# Patient Record
Sex: Male | Born: 1935 | Race: White | Hispanic: No | Smoking: Former smoker
Health system: Southern US, Community
[De-identification: ages and names within clinical notes are randomized; demographics above are authoritative.]

## PROBLEM LIST (undated history)

## (undated) DIAGNOSIS — I639 Cerebral infarction, unspecified: Secondary | ICD-10-CM

## (undated) DIAGNOSIS — E785 Hyperlipidemia, unspecified: Secondary | ICD-10-CM

## (undated) DIAGNOSIS — I10 Essential (primary) hypertension: Secondary | ICD-10-CM

## (undated) DIAGNOSIS — N179 Acute kidney failure, unspecified: Secondary | ICD-10-CM

## (undated) DIAGNOSIS — D4100 Neoplasm of uncertain behavior of unspecified kidney: Secondary | ICD-10-CM

## (undated) DIAGNOSIS — I82409 Acute embolism and thrombosis of unspecified deep veins of unspecified lower extremity: Secondary | ICD-10-CM

## (undated) DIAGNOSIS — G629 Polyneuropathy, unspecified: Secondary | ICD-10-CM

## (undated) DIAGNOSIS — N3000 Acute cystitis without hematuria: Secondary | ICD-10-CM

## (undated) DIAGNOSIS — R42 Dizziness and giddiness: Secondary | ICD-10-CM

## (undated) DIAGNOSIS — R339 Retention of urine, unspecified: Secondary | ICD-10-CM

## (undated) DIAGNOSIS — Y92009 Unspecified place in unspecified non-institutional (private) residence as the place of occurrence of the external cause: Secondary | ICD-10-CM

## (undated) DIAGNOSIS — E876 Hypokalemia: Secondary | ICD-10-CM

## (undated) DIAGNOSIS — N3941 Urge incontinence: Secondary | ICD-10-CM

## (undated) DIAGNOSIS — I4891 Unspecified atrial fibrillation: Secondary | ICD-10-CM

## (undated) DIAGNOSIS — I499 Cardiac arrhythmia, unspecified: Secondary | ICD-10-CM

## (undated) DIAGNOSIS — N3281 Overactive bladder: Secondary | ICD-10-CM

## (undated) DIAGNOSIS — I2510 Atherosclerotic heart disease of native coronary artery without angina pectoris: Secondary | ICD-10-CM

## (undated) DIAGNOSIS — W19XXXA Unspecified fall, initial encounter: Secondary | ICD-10-CM

## (undated) DIAGNOSIS — C61 Malignant neoplasm of prostate: Secondary | ICD-10-CM

## (undated) DIAGNOSIS — M1990 Unspecified osteoarthritis, unspecified site: Secondary | ICD-10-CM

## (undated) DIAGNOSIS — R351 Nocturia: Secondary | ICD-10-CM

## (undated) DIAGNOSIS — B029 Zoster without complications: Secondary | ICD-10-CM

## (undated) DIAGNOSIS — I2699 Other pulmonary embolism without acute cor pulmonale: Secondary | ICD-10-CM

## (undated) DIAGNOSIS — R159 Full incontinence of feces: Secondary | ICD-10-CM

## (undated) DIAGNOSIS — L719 Rosacea, unspecified: Secondary | ICD-10-CM

## (undated) HISTORY — DX: Acute kidney failure, unspecified: N17.9

## (undated) HISTORY — DX: Unspecified place in unspecified non-institutional (private) residence as the place of occurrence of the external cause: W19.XXXA

## (undated) HISTORY — DX: Other pulmonary embolism without acute cor pulmonale: I26.99

## (undated) HISTORY — DX: Overactive bladder: N32.81

## (undated) HISTORY — DX: Essential (primary) hypertension: I10

## (undated) HISTORY — DX: Malignant neoplasm of prostate: C61

## (undated) HISTORY — PX: APPENDECTOMY: SHX54

## (undated) HISTORY — DX: Unspecified atrial fibrillation: I48.91

## (undated) HISTORY — PX: NASAL SINUS SURGERY: SHX719

## (undated) HISTORY — DX: Unspecified osteoarthritis, unspecified site: M19.90

## (undated) HISTORY — DX: Unspecified place in unspecified non-institutional (private) residence as the place of occurrence of the external cause: Y92.009

## (undated) HISTORY — DX: Neoplasm of uncertain behavior of unspecified kidney: D41.00

## (undated) HISTORY — DX: Rosacea, unspecified: L71.9

## (undated) HISTORY — PX: CHOLECYSTECTOMY: SHX55

## (undated) HISTORY — PX: OTHER SURGICAL HISTORY: SHX169

## (undated) HISTORY — DX: Nocturia: R35.1

## (undated) HISTORY — DX: Zoster without complications: B02.9

## (undated) HISTORY — DX: Full incontinence of feces: R15.9

## (undated) HISTORY — DX: Hyperlipidemia, unspecified: E78.5

## (undated) HISTORY — DX: Polyneuropathy, unspecified: G62.9

## (undated) HISTORY — DX: Retention of urine, unspecified: R33.9

## (undated) HISTORY — DX: Dizziness and giddiness: R42

## (undated) HISTORY — DX: Atherosclerotic heart disease of native coronary artery without angina pectoris: I25.10

## (undated) HISTORY — DX: Urge incontinence: N39.41

## (undated) HISTORY — DX: Acute embolism and thrombosis of unspecified deep veins of unspecified lower extremity: I82.409

## (undated) HISTORY — DX: Cardiac arrhythmia, unspecified: I49.9

## (undated) HISTORY — DX: Acute cystitis without hematuria: N30.00

## (undated) HISTORY — DX: Cerebral infarction, unspecified: I63.9

## (undated) HISTORY — PX: HERNIA REPAIR: SHX51

## (undated) HISTORY — DX: Hypokalemia: E87.6

---

## 1993-01-22 HISTORY — PX: CATARACT EXTRACTION: SUR2

## 2004-10-28 ENCOUNTER — Emergency Department: Payer: Self-pay | Admitting: Emergency Medicine

## 2004-10-28 ENCOUNTER — Other Ambulatory Visit: Payer: Self-pay

## 2008-07-30 ENCOUNTER — Ambulatory Visit: Payer: Self-pay | Admitting: Unknown Physician Specialty

## 2009-02-19 ENCOUNTER — Inpatient Hospital Stay: Payer: Self-pay | Admitting: Unknown Physician Specialty

## 2010-04-25 ENCOUNTER — Inpatient Hospital Stay: Payer: Self-pay | Admitting: Internal Medicine

## 2010-05-04 DIAGNOSIS — I209 Angina pectoris, unspecified: Secondary | ICD-10-CM

## 2010-05-04 DIAGNOSIS — I4891 Unspecified atrial fibrillation: Secondary | ICD-10-CM

## 2010-05-04 DIAGNOSIS — I6990 Unspecified sequelae of unspecified cerebrovascular disease: Secondary | ICD-10-CM

## 2010-09-27 ENCOUNTER — Ambulatory Visit: Payer: Self-pay | Admitting: Internal Medicine

## 2010-10-16 ENCOUNTER — Ambulatory Visit: Payer: Self-pay | Admitting: Unknown Physician Specialty

## 2010-10-16 ENCOUNTER — Inpatient Hospital Stay: Payer: Self-pay | Admitting: Internal Medicine

## 2010-10-21 ENCOUNTER — Other Ambulatory Visit: Payer: Self-pay | Admitting: Unknown Physician Specialty

## 2010-12-27 ENCOUNTER — Ambulatory Visit: Payer: Self-pay | Admitting: Urology

## 2011-04-16 ENCOUNTER — Other Ambulatory Visit: Payer: Medicare Other

## 2011-05-15 ENCOUNTER — Other Ambulatory Visit: Payer: Medicare Other

## 2011-12-12 DIAGNOSIS — D419 Neoplasm of uncertain behavior of unspecified urinary organ: Secondary | ICD-10-CM | POA: Insufficient documentation

## 2011-12-12 DIAGNOSIS — N3941 Urge incontinence: Secondary | ICD-10-CM | POA: Insufficient documentation

## 2011-12-12 DIAGNOSIS — R351 Nocturia: Secondary | ICD-10-CM | POA: Insufficient documentation

## 2011-12-12 DIAGNOSIS — N390 Urinary tract infection, site not specified: Secondary | ICD-10-CM | POA: Insufficient documentation

## 2011-12-12 DIAGNOSIS — R339 Retention of urine, unspecified: Secondary | ICD-10-CM | POA: Insufficient documentation

## 2011-12-12 DIAGNOSIS — N3000 Acute cystitis without hematuria: Secondary | ICD-10-CM | POA: Insufficient documentation

## 2011-12-12 DIAGNOSIS — Z23 Encounter for immunization: Secondary | ICD-10-CM | POA: Insufficient documentation

## 2011-12-12 DIAGNOSIS — C61 Malignant neoplasm of prostate: Secondary | ICD-10-CM | POA: Insufficient documentation

## 2012-03-31 ENCOUNTER — Emergency Department: Payer: Self-pay | Admitting: Emergency Medicine

## 2012-03-31 LAB — PROTIME-INR: INR: 2.5

## 2012-06-21 LAB — CBC
HCT: 37.9 % — ABNORMAL LOW (ref 40.0–52.0)
HGB: 12.9 g/dL — ABNORMAL LOW (ref 13.0–18.0)
MCHC: 34 g/dL (ref 32.0–36.0)
MCV: 89 fL (ref 80–100)
Platelet: 220 10*3/uL (ref 150–440)
RDW: 14.9 % — ABNORMAL HIGH (ref 11.5–14.5)
WBC: 16.4 10*3/uL — ABNORMAL HIGH (ref 3.8–10.6)

## 2012-06-21 LAB — COMPREHENSIVE METABOLIC PANEL
Alkaline Phosphatase: 73 U/L (ref 50–136)
Anion Gap: 11 (ref 7–16)
Calcium, Total: 8.8 mg/dL (ref 8.5–10.1)
Chloride: 103 mmol/L (ref 98–107)
Creatinine: 1.28 mg/dL (ref 0.60–1.30)
EGFR (African American): 56 — ABNORMAL LOW
EGFR (Non-African Amer.): 48 — ABNORMAL LOW
Glucose: 205 mg/dL — ABNORMAL HIGH (ref 65–99)
Osmolality: 281 (ref 275–301)
SGOT(AST): 33 U/L (ref 15–37)
SGPT (ALT): 21 U/L (ref 12–78)
Sodium: 136 mmol/L (ref 136–145)

## 2012-06-21 LAB — CK TOTAL AND CKMB (NOT AT ARMC)
CK, Total: 193 U/L (ref 35–232)
CK-MB: 2.6 ng/mL (ref 0.5–3.6)

## 2012-06-21 LAB — PROTIME-INR: Prothrombin Time: 22.9 secs — ABNORMAL HIGH (ref 11.5–14.7)

## 2012-06-21 LAB — APTT: Activated PTT: 51.4 secs — ABNORMAL HIGH (ref 23.6–35.9)

## 2012-06-22 ENCOUNTER — Observation Stay: Payer: Self-pay

## 2012-06-22 LAB — URINALYSIS, COMPLETE
Bacteria: NONE SEEN
Blood: NEGATIVE
Hyaline Cast: 12
Ph: 5 (ref 4.5–8.0)
Specific Gravity: 1.019 (ref 1.003–1.030)
Squamous Epithelial: <1
WBC UR: 2 /HPF (ref 0–5)

## 2012-06-23 LAB — CBC WITH DIFFERENTIAL/PLATELET
Basophil #: 0 10*3/uL (ref 0.0–0.1)
Basophil %: 0.3 %
Eosinophil #: 0 10*3/uL (ref 0.0–0.7)
Eosinophil %: 0.2 %
HCT: 35.7 % — ABNORMAL LOW (ref 40.0–52.0)
HGB: 12.3 g/dL — ABNORMAL LOW (ref 13.0–18.0)
Lymphocyte %: 12.8 %
Monocyte #: 1.1 x10 3/mm — ABNORMAL HIGH (ref 0.2–1.0)
Monocyte %: 12.1 %
Neutrophil %: 74.6 %
Platelet: 208 10*3/uL (ref 150–440)
RDW: 14.5 % (ref 11.5–14.5)

## 2012-06-23 LAB — COMPREHENSIVE METABOLIC PANEL
Albumin: 2.8 g/dL — ABNORMAL LOW (ref 3.4–5.0)
BUN: 14 mg/dL (ref 7–18)
Chloride: 101 mmol/L (ref 98–107)
Co2: 27 mmol/L (ref 21–32)
Creatinine: 1.05 mg/dL (ref 0.60–1.30)
EGFR (African American): >60
Glucose: 161 mg/dL — ABNORMAL HIGH (ref 65–99)
Osmolality: 274 (ref 275–301)
Potassium: 3.7 mmol/L (ref 3.5–5.1)
Sodium: 135 mmol/L — ABNORMAL LOW (ref 136–145)

## 2012-06-27 LAB — CULTURE, BLOOD (SINGLE)

## 2013-05-20 DIAGNOSIS — I4891 Unspecified atrial fibrillation: Secondary | ICD-10-CM | POA: Insufficient documentation

## 2013-07-31 DIAGNOSIS — M179 Osteoarthritis of knee, unspecified: Secondary | ICD-10-CM | POA: Insufficient documentation

## 2013-07-31 DIAGNOSIS — M1710 Unilateral primary osteoarthritis, unspecified knee: Secondary | ICD-10-CM | POA: Insufficient documentation

## 2013-10-08 ENCOUNTER — Inpatient Hospital Stay: Payer: Self-pay | Admitting: Internal Medicine

## 2013-10-08 LAB — COMPREHENSIVE METABOLIC PANEL
ALT: 54 U/L
Albumin: 3 g/dL — ABNORMAL LOW (ref 3.4–5.0)
Alkaline Phosphatase: 74 U/L
Anion Gap: 13 (ref 7–16)
BUN: 35 mg/dL — AB (ref 7–18)
Bilirubin,Total: 0.8 mg/dL (ref 0.2–1.0)
CHLORIDE: 104 mmol/L (ref 98–107)
CO2: 20 mmol/L — AB (ref 21–32)
CREATININE: 1.64 mg/dL — AB (ref 0.60–1.30)
Calcium, Total: 8.7 mg/dL (ref 8.5–10.1)
EGFR (African American): 41 — ABNORMAL LOW
EGFR (Non-African Amer.): 35 — ABNORMAL LOW
Glucose: 170 mg/dL — ABNORMAL HIGH (ref 65–99)
Osmolality: 286 (ref 275–301)
POTASSIUM: 4 mmol/L (ref 3.5–5.1)
SGOT(AST): 259 U/L — ABNORMAL HIGH (ref 15–37)
SODIUM: 137 mmol/L (ref 136–145)
TOTAL PROTEIN: 6.4 g/dL (ref 6.4–8.2)

## 2013-10-08 LAB — URINALYSIS, COMPLETE
Bilirubin,UR: NEGATIVE
GLUCOSE, UR: NEGATIVE mg/dL (ref 0–75)
NITRITE: NEGATIVE
PH: 5 (ref 4.5–8.0)
Protein: 100
RBC,UR: 22 /HPF (ref 0–5)
SQUAMOUS EPITHELIAL: NONE SEEN
Specific Gravity: 1.016 (ref 1.003–1.030)
Transitional Epi: 2
WBC UR: 937 /HPF (ref 0–5)

## 2013-10-08 LAB — CK-MB
CK-MB: 76.6 ng/mL — ABNORMAL HIGH (ref 0.5–3.6)
CK-MB: 55.9 ng/mL — ABNORMAL HIGH (ref 0.5–3.6)

## 2013-10-08 LAB — CBC
HCT: 35.6 % — ABNORMAL LOW (ref 40.0–52.0)
HGB: 12.2 g/dL — ABNORMAL LOW (ref 13.0–18.0)
MCH: 31.2 pg (ref 26.0–34.0)
MCHC: 34.3 g/dL (ref 32.0–36.0)
MCV: 91 fL (ref 80–100)
Platelet: 204 10*3/uL (ref 150–440)
RBC: 3.91 10*6/uL — ABNORMAL LOW (ref 4.40–5.90)
RDW: 14.3 % (ref 11.5–14.5)
WBC: 13.2 10*3/uL — AB (ref 3.8–10.6)

## 2013-10-08 LAB — CK: CK, TOTAL: 11416 U/L — AB

## 2013-10-08 LAB — DIGOXIN LEVEL: Digoxin: 1.6 ng/mL

## 2013-10-08 LAB — PROTIME-INR
INR: 3
Prothrombin Time: 30.1 secs — ABNORMAL HIGH (ref 11.5–14.7)

## 2013-10-08 LAB — APTT: Activated PTT: 73.2 secs — ABNORMAL HIGH (ref 23.6–35.9)

## 2013-10-08 LAB — TROPONIN I: Troponin-I: 0.4 ng/mL — ABNORMAL HIGH

## 2013-10-09 LAB — CBC WITH DIFFERENTIAL/PLATELET
Basophil #: 0 10*3/uL (ref 0.0–0.1)
Basophil %: 0.1 %
EOS ABS: 0 10*3/uL (ref 0.0–0.7)
Eosinophil %: 0 %
HCT: 33.8 % — ABNORMAL LOW (ref 40.0–52.0)
HGB: 11 g/dL — AB (ref 13.0–18.0)
Lymphocyte #: 0.5 10*3/uL — ABNORMAL LOW (ref 1.0–3.6)
Lymphocyte %: 3.5 %
MCH: 30.1 pg (ref 26.0–34.0)
MCHC: 32.6 g/dL (ref 32.0–36.0)
MCV: 92 fL (ref 80–100)
Monocyte #: 1.2 x10 3/mm — ABNORMAL HIGH (ref 0.2–1.0)
Monocyte %: 7.6 %
Neutrophil #: 13.9 10*3/uL — ABNORMAL HIGH (ref 1.4–6.5)
Neutrophil %: 88.8 %
PLATELETS: 174 10*3/uL (ref 150–440)
RBC: 3.66 10*6/uL — ABNORMAL LOW (ref 4.40–5.90)
RDW: 14.4 % (ref 11.5–14.5)
WBC: 15.6 10*3/uL — ABNORMAL HIGH (ref 3.8–10.6)

## 2013-10-09 LAB — BASIC METABOLIC PANEL
ANION GAP: 14 (ref 7–16)
BUN: 41 mg/dL — ABNORMAL HIGH (ref 7–18)
CALCIUM: 8.6 mg/dL (ref 8.5–10.1)
CHLORIDE: 105 mmol/L (ref 98–107)
Co2: 17 mmol/L — ABNORMAL LOW (ref 21–32)
Creatinine: 1.55 mg/dL — ABNORMAL HIGH (ref 0.60–1.30)
EGFR (African American): 44 — ABNORMAL LOW
EGFR (Non-African Amer.): 38 — ABNORMAL LOW
Glucose: 166 mg/dL — ABNORMAL HIGH (ref 65–99)
Osmolality: 286 (ref 275–301)
POTASSIUM: 5.5 mmol/L — AB (ref 3.5–5.1)
Sodium: 136 mmol/L (ref 136–145)

## 2013-10-09 LAB — CK-MB: CK-MB: 39.3 ng/mL — AB (ref 0.5–3.6)

## 2013-10-09 LAB — CK: CK, Total: 5306 U/L — ABNORMAL HIGH

## 2013-10-09 LAB — POTASSIUM: Potassium: 3.6 mmol/L (ref 3.5–5.1)

## 2013-10-09 LAB — TROPONIN I: Troponin-I: 0.28 ng/mL — ABNORMAL HIGH

## 2013-10-10 LAB — CBC WITH DIFFERENTIAL/PLATELET
Basophil #: 0 10*3/uL (ref 0.0–0.1)
Basophil %: 0.2 %
EOS ABS: 0.1 10*3/uL (ref 0.0–0.7)
Eosinophil %: 0.9 %
HCT: 32.8 % — ABNORMAL LOW (ref 40.0–52.0)
HGB: 11 g/dL — ABNORMAL LOW (ref 13.0–18.0)
Lymphocyte #: 0.9 10*3/uL — ABNORMAL LOW (ref 1.0–3.6)
Lymphocyte %: 8.6 %
MCH: 30.6 pg (ref 26.0–34.0)
MCHC: 33.6 g/dL (ref 32.0–36.0)
MCV: 91 fL (ref 80–100)
Monocyte #: 0.5 x10 3/mm (ref 0.2–1.0)
Monocyte %: 5 %
Neutrophil #: 9 10*3/uL — ABNORMAL HIGH (ref 1.4–6.5)
Neutrophil %: 85.3 %
Platelet: 168 10*3/uL (ref 150–440)
RBC: 3.61 10*6/uL — ABNORMAL LOW (ref 4.40–5.90)
RDW: 14.8 % — AB (ref 11.5–14.5)
WBC: 10.5 10*3/uL (ref 3.8–10.6)

## 2013-10-10 LAB — MAGNESIUM: MAGNESIUM: 1.8 mg/dL

## 2013-10-10 LAB — BASIC METABOLIC PANEL
Anion Gap: 12 (ref 7–16)
BUN: 44 mg/dL — AB (ref 7–18)
CHLORIDE: 101 mmol/L (ref 98–107)
CREATININE: 1.59 mg/dL — AB (ref 0.60–1.30)
Calcium, Total: 7.9 mg/dL — ABNORMAL LOW (ref 8.5–10.1)
Co2: 23 mmol/L (ref 21–32)
EGFR (Non-African Amer.): 37 — ABNORMAL LOW
GFR CALC AF AMER: 42 — AB
Glucose: 153 mg/dL — ABNORMAL HIGH (ref 65–99)
OSMOLALITY: 286 (ref 275–301)
Potassium: 3.2 mmol/L — ABNORMAL LOW (ref 3.5–5.1)
SODIUM: 136 mmol/L (ref 136–145)

## 2013-10-10 LAB — TROPONIN I: TROPONIN-I: 0.2 ng/mL — AB

## 2013-10-10 LAB — PROTIME-INR
INR: 2.4
PROTHROMBIN TIME: 25.3 s — AB (ref 11.5–14.7)

## 2013-10-11 LAB — PROTIME-INR
INR: 2.3
PROTHROMBIN TIME: 24.7 s — AB (ref 11.5–14.7)

## 2013-10-11 LAB — BASIC METABOLIC PANEL
Anion Gap: 12 (ref 7–16)
BUN: 37 mg/dL — AB (ref 7–18)
CHLORIDE: 104 mmol/L (ref 98–107)
CREATININE: 1.44 mg/dL — AB (ref 0.60–1.30)
Calcium, Total: 7.8 mg/dL — ABNORMAL LOW (ref 8.5–10.1)
Co2: 22 mmol/L (ref 21–32)
EGFR (Non-African Amer.): 41 — ABNORMAL LOW
GFR CALC AF AMER: 48 — AB
GLUCOSE: 142 mg/dL — AB (ref 65–99)
Osmolality: 287 (ref 275–301)
Potassium: 3.5 mmol/L (ref 3.5–5.1)
SODIUM: 138 mmol/L (ref 136–145)

## 2013-10-11 LAB — CK: CK, Total: 1256 U/L — ABNORMAL HIGH

## 2013-10-12 LAB — CULTURE, BLOOD (SINGLE)

## 2013-10-12 LAB — BASIC METABOLIC PANEL
Anion Gap: 7 (ref 7–16)
BUN: 34 mg/dL — ABNORMAL HIGH (ref 7–18)
CHLORIDE: 106 mmol/L (ref 98–107)
CO2: 26 mmol/L (ref 21–32)
Calcium, Total: 8.1 mg/dL — ABNORMAL LOW (ref 8.5–10.1)
Creatinine: 1.31 mg/dL — ABNORMAL HIGH (ref 0.60–1.30)
EGFR (African American): 54 — ABNORMAL LOW
GFR CALC NON AF AMER: 46 — AB
Glucose: 139 mg/dL — ABNORMAL HIGH (ref 65–99)
OSMOLALITY: 287 (ref 275–301)
Potassium: 3.8 mmol/L (ref 3.5–5.1)
Sodium: 139 mmol/L (ref 136–145)

## 2013-10-12 LAB — URINE CULTURE

## 2013-10-12 LAB — PROTIME-INR
INR: 2.3
Prothrombin Time: 24.8 secs — ABNORMAL HIGH (ref 11.5–14.7)

## 2013-10-13 ENCOUNTER — Non-Acute Institutional Stay (SKILLED_NURSING_FACILITY): Payer: Medicare Other | Admitting: Adult Health

## 2013-10-13 ENCOUNTER — Other Ambulatory Visit: Payer: Self-pay | Admitting: *Deleted

## 2013-10-13 DIAGNOSIS — I4891 Unspecified atrial fibrillation: Secondary | ICD-10-CM

## 2013-10-13 DIAGNOSIS — K219 Gastro-esophageal reflux disease without esophagitis: Secondary | ICD-10-CM

## 2013-10-13 DIAGNOSIS — I10 Essential (primary) hypertension: Secondary | ICD-10-CM

## 2013-10-13 DIAGNOSIS — B9620 Unspecified Escherichia coli [E. coli] as the cause of diseases classified elsewhere: Secondary | ICD-10-CM

## 2013-10-13 DIAGNOSIS — R5381 Other malaise: Secondary | ICD-10-CM

## 2013-10-13 DIAGNOSIS — I82409 Acute embolism and thrombosis of unspecified deep veins of unspecified lower extremity: Secondary | ICD-10-CM

## 2013-10-13 DIAGNOSIS — I482 Chronic atrial fibrillation, unspecified: Secondary | ICD-10-CM

## 2013-10-13 DIAGNOSIS — A498 Other bacterial infections of unspecified site: Secondary | ICD-10-CM

## 2013-10-13 DIAGNOSIS — Z7901 Long term (current) use of anticoagulants: Secondary | ICD-10-CM

## 2013-10-13 DIAGNOSIS — I6990 Unspecified sequelae of unspecified cerebrovascular disease: Secondary | ICD-10-CM

## 2013-10-13 DIAGNOSIS — N390 Urinary tract infection, site not specified: Secondary | ICD-10-CM

## 2013-10-13 MED ORDER — HYDROCODONE-ACETAMINOPHEN 5-325 MG PO TABS: ORAL_TABLET | ORAL | Status: DC

## 2013-10-13 NOTE — Telephone Encounter (Signed)
[facility] 

## 2013-10-14 ENCOUNTER — Encounter: Payer: Self-pay | Admitting: Adult Health

## 2013-10-14 DIAGNOSIS — I482 Chronic atrial fibrillation, unspecified: Secondary | ICD-10-CM | POA: Insufficient documentation

## 2013-10-14 DIAGNOSIS — I10 Essential (primary) hypertension: Secondary | ICD-10-CM | POA: Insufficient documentation

## 2013-10-14 DIAGNOSIS — B9620 Unspecified Escherichia coli [E. coli] as the cause of diseases classified elsewhere: Secondary | ICD-10-CM | POA: Insufficient documentation

## 2013-10-14 DIAGNOSIS — Z7901 Long term (current) use of anticoagulants: Secondary | ICD-10-CM | POA: Insufficient documentation

## 2013-10-14 DIAGNOSIS — I82409 Acute embolism and thrombosis of unspecified deep veins of unspecified lower extremity: Secondary | ICD-10-CM | POA: Insufficient documentation

## 2013-10-14 DIAGNOSIS — I6990 Unspecified sequelae of unspecified cerebrovascular disease: Secondary | ICD-10-CM | POA: Insufficient documentation

## 2013-10-14 DIAGNOSIS — K219 Gastro-esophageal reflux disease without esophagitis: Secondary | ICD-10-CM | POA: Insufficient documentation

## 2013-10-14 DIAGNOSIS — N390 Urinary tract infection, site not specified: Secondary | ICD-10-CM

## 2013-10-14 DIAGNOSIS — R5381 Other malaise: Secondary | ICD-10-CM | POA: Insufficient documentation

## 2013-10-14 NOTE — Progress Notes (Signed)
Patient ID: Samuel Wagner, male   DOB: 07-24-1916, 80 y.o.   MRN: [id number]     [facility]  Allergies  Allergen Reactions  . Sulfa Antibiotics      Chief Complaint  Patient presents with  . Hospitalization Follow-up    HPI:  He had had 2 falls at home; was hospitalized with sepsis due to e-coli uti; rhabdomyolysis. I could not find the culture report in the hospital records. He is on long term coumadin therapy for afib; is status post dvt; cva as well. I have spoken with his pcp Dr. Ola Spurr who feels he will continue to benefit from coumadin therapy and asa therapy at this time. He had been living independently prior to his hospitalization with family support. His goal is to return back home.   Past Medical History  Diagnosis Date  . A-fib   . Acute renal failure   . DVT (deep venous thrombosis)   . Hypokalemia   . Hypertension   . Prostate cancer   . Stroke   . CAD (coronary artery disease)   . Hyperlipidemia   . Arthritis   . Pulmonary embolism   . Fall at home     Past Surgical History  Procedure Laterality Date  . Appendectomy    . Cholecystectomy    . Hernia repair    . Right eye surgery    . Nasal sinus surgery      VITAL SIGNS BP 143/87  Pulse 88  Ht 5' 9"  (1.753 m)  Wt 203 lb (92.08 kg)  BMI 29.96 kg/m2   Patient's Medications  New Prescriptions   No medications on file  Previous Medications   ASPIRIN 81 MG TABLET    Take 81 mg by mouth daily.   DIGOXIN (LANOXIN) 0.25 MG TABLET    Take 0.25 mg by mouth daily.   HYDROCODONE-ACETAMINOPHEN (NORCO/VICODIN) 5-325 MG PER TABLET    Take one tablet by mouth every 6 hours as needed for severe pain   METOPROLOL (LOPRESSOR) 50 MG TABLET    Take 25 mg by mouth 2 (two) times daily.   MULTIPLE VITAMINS-MINERALS (PRESERVISION AREDS) TABS    Take 1 tablet by mouth 2 (two) times daily.   PANTOPRAZOLE (PROTONIX) 40 MG TABLET    Take 40 mg by mouth daily.   TRAMADOL (ULTRAM) 50 MG TABLET    Take 50 mg by  mouth every 4 (four) hours as needed.   WARFARIN (COUMADIN) 1 MG TABLET    Take 1 mg by mouth daily at 6 PM. 4.5 mg daily  Modified Medications   No medications on file  Discontinued Medications   No medications on file    SIGNIFICANT DIAGNOSTIC EXAMS  10-08-13: left shoulder x-ray: equivocal nondisplaced intra-articular glenoid fracture. Moderate glenohumeral joint degenerative changes.   10-08-13: chest x-ray: no acute cardiopulmonary disease    LABS REVIEWED:  10-08-13 wbc 13.;2 hgb 12.2; hct 35.6; mcv 91 plt 204 glucose 170; bun 35; creat 1.64; k+4.0; na++137; alk phos 74; ast 54; alt 259; t bili 0.8; albumin 3.0 ptt 73.2 CK 11, 416; CK-MB: 76.6 #2: 55.9; dig 1.6 urine culture: gram neg rods.  10-09-13: wbc 15.6; hgb 11.0;; hct 33.8; mcv 92; plt 174; CK 5306; CK-MB: 39.3  10-10-13: wbc 10.5; hgb 11.0; hct 32.8; mcv 91; plt 168 glucose 153; bun 44; creat 1.59; k+3.2; na++136; mag 1.8  10-11-13: glucose 142; bun 37; creat 1.44; k+3.5; na++142; CK 1256  10-12-13: glucose 139; bun 34; creat 1.31; k+3.8;  na++139     Review of Systems  Constitutional: Positive for malaise/fatigue. Negative for weight loss.       Has had decreased appetite for the past 2 months without known weight loss  Respiratory: Negative for cough and shortness of breath.   Cardiovascular: Negative for chest pain, palpitations and leg swelling.  Gastrointestinal: Negative for heartburn, abdominal pain and constipation.  Musculoskeletal: Negative for joint pain and myalgias.  Skin: Negative.   Neurological: Negative for headaches.  Psychiatric/Behavioral: Negative for depression. The patient has insomnia. The patient is not nervous/anxious.      Physical Exam  Constitutional: He is oriented to person, place, and time. He appears well-developed and well-nourished.  Neck: Neck supple. No JVD present. No thyromegaly present.  Cardiovascular: Normal rate, normal heart sounds and intact distal pulses.   Heart rate  irregular   Respiratory: Effort normal and breath sounds normal. No respiratory distress. He has no wheezes.  GI: Soft. Bowel sounds are normal. He exhibits no distension. There is no tenderness.  Musculoskeletal: Normal range of motion. He exhibits no edema.  Neurological: He is alert and oriented to person, place, and time.  Skin: Skin is warm and dry. He is not diaphoretic.  Psychiatric: He has a normal mood and affect.      ASSESSMENT/ PLAN:  1. Afib: heart rate controlled; will continue digoxin 0.25 mg daily for rate control along with lopressor 25 mg twice daily for rate control; will continue asa 81 mg daily and coumadin 4.5 mg daily his inr is 2.7.   2. UTI from e-coli: will complete cipro and will monitor his status   3. Hypertension: will continue lopressor 25 mg twice daily  4. Gerd: will continue protonix 40 mg daily   5. Physical deconditioning with falls at home: will continue therapy as directed to improve upon his strength and independence with adl's. Will monitor his weight; and will provide nutritional support as indicated.   Time spent with patient 50 minutes.    Ok Edwards NP [facility]  Contact [phone] Monday through Friday 8am- 5pm  After hours call [phone]

## 2013-10-15 ENCOUNTER — Non-Acute Institutional Stay (SKILLED_NURSING_FACILITY): Payer: Medicare Other | Admitting: Internal Medicine

## 2013-10-15 DIAGNOSIS — N390 Urinary tract infection, site not specified: Secondary | ICD-10-CM

## 2013-10-15 DIAGNOSIS — S42152S Displaced fracture of neck of scapula, left shoulder, sequela: Secondary | ICD-10-CM

## 2013-10-15 DIAGNOSIS — S42153A Displaced fracture of neck of scapula, unspecified shoulder, initial encounter for closed fracture: Secondary | ICD-10-CM

## 2013-10-15 DIAGNOSIS — K5900 Constipation, unspecified: Secondary | ICD-10-CM

## 2013-10-15 DIAGNOSIS — B9620 Unspecified Escherichia coli [E. coli] as the cause of diseases classified elsewhere: Secondary | ICD-10-CM

## 2013-10-15 DIAGNOSIS — S42142S Displaced fracture of glenoid cavity of scapula, left shoulder, sequela: Secondary | ICD-10-CM

## 2013-10-15 DIAGNOSIS — I482 Chronic atrial fibrillation, unspecified: Secondary | ICD-10-CM

## 2013-10-15 DIAGNOSIS — I4891 Unspecified atrial fibrillation: Secondary | ICD-10-CM

## 2013-10-15 DIAGNOSIS — S42309S Unspecified fracture of shaft of humerus, unspecified arm, sequela: Secondary | ICD-10-CM

## 2013-10-15 DIAGNOSIS — S42143A Displaced fracture of glenoid cavity of scapula, unspecified shoulder, initial encounter for closed fracture: Secondary | ICD-10-CM | POA: Insufficient documentation

## 2013-10-15 DIAGNOSIS — A498 Other bacterial infections of unspecified site: Secondary | ICD-10-CM

## 2013-10-15 DIAGNOSIS — R5381 Other malaise: Secondary | ICD-10-CM

## 2013-10-15 DIAGNOSIS — K219 Gastro-esophageal reflux disease without esophagitis: Secondary | ICD-10-CM

## 2013-10-15 NOTE — Progress Notes (Signed)
Patient ID: Samuel Wagner, male   DOB: 05/13/1916, 80 y.o.   MRN: [id number]     Facility: [facility]    PCP: Viviana Simpler, MD  Code Status: DNR  Allergies  Allergen Reactions  . Sulfa Antibiotics     Chief Complaint: new admission  HPI:  80 y/o male patient is here for STR after hospital admission from 10/08/13-10/12/13 with a fall. He was diagnosed to have e.coli UTI and bacteremia. He was started on antibiotics. He also had rhabdomylosis and AKI  in setting of the fall and responded well to iv fluids. Cardiology was consulted with elevated troponin and it was thought to be from demand ischemia. He was also noted to have left glenoid fracture and orthopedic was consulted, conservative management with pain control was decided upon. He has history of afib and is a fall risk but per notes, family discussions were in place and it was decided to continue the coumadin.  He is seen in his room today. He has had his breakfast. He has been working with therapy team. He denies any complaints. No concerns from the staff  Review of Systems:  Constitutional: Negative for fever, chills, diaphoresis.  HENT: Negative for congestion Eyes: Negative for eye pain, blurred vision, double vision and discharge.  Respiratory: positive for dry cough. Negative for sputum production, shortness of breath and wheezing.   Cardiovascular: Negative for chest pain, palpitations, orthopnea and leg swelling.  Gastrointestinal: Negative for heartburn, nausea, vomiting, abdominal pain. Has constipation.  Genitourinary: Negative for dysuria but has mild left lower abdomen discomfort Musculoskeletal: Negative for back pain, falls  Skin: Negative for itching and rash. easy skin bruising present Neurological: Negative for dizziness, tingling, focal weakness and headaches.  Psychiatric/Behavioral: Negative for depression.     Past Medical History  Diagnosis Date  . A-fib   . Acute renal  failure   . DVT (deep venous thrombosis)   . Hypokalemia   . Hypertension   . Prostate cancer   . Stroke   . CAD (coronary artery disease)   . Hyperlipidemia   . Arthritis   . Pulmonary embolism   . Fall at home    Past Surgical History  Procedure Laterality Date  . Appendectomy    . Cholecystectomy    . Hernia repair    . Right eye surgery    . Nasal sinus surgery     Social History:   reports that he has never smoked. He does not have any smokeless tobacco history on file. He reports that he does not drink alcohol or use illicit drugs.  Family History  Problem Relation Age of Onset  . Stroke Mother   . Stroke Father   . Cancer Father   . Cancer Son     Medications: Patient's Medications  New Prescriptions   No medications on file  Previous Medications   ASPIRIN 81 MG TABLET    Take 81 mg by mouth daily.   CIPROFLOXACIN (CIPRO) 500 MG TABLET    Take 500 mg by mouth 2 (two) times daily. Until 10/20/13   DIGOXIN (LANOXIN) 0.25 MG TABLET    Take 0.25 mg by mouth daily.   HYDROCODONE-ACETAMINOPHEN (NORCO/VICODIN) 5-325 MG PER TABLET    Take one tablet by mouth every 6 hours as needed for severe pain   METOPROLOL (LOPRESSOR) 50 MG TABLET    Take 50 mg by mouth 2 (two) times daily.   MULTIPLE VITAMINS-MINERALS (PRESERVISION AREDS) TABS  Take 1 tablet by mouth 2 (two) times daily.   PANTOPRAZOLE (PROTONIX) 40 MG TABLET    Take 40 mg by mouth daily.   TRAMADOL (ULTRAM) 50 MG TABLET    Take 50 mg by mouth every 4 (four) hours as needed.   WARFARIN (COUMADIN) 1 MG TABLET    Take 1 mg by mouth daily at 6 PM. 4.5 mg daily  Modified Medications   No medications on file  Discontinued Medications   No medications on file     Physical Exam: Filed Vitals:   10/15/13 0906  BP: 145/72  Pulse: 90  Temp: 97.7 F (36.5 C)  Resp: 18  SpO2: 96%    General- elderly male in no acute distress Head- has bruise on his head Eyes- PERRLA, EOMI, no pallor, no icterus, no  discharge Neck- no lymphadenopathy Throat- moist mucus membrane Cardiovascular- irregular heart rate, no murmurs/ rubs/ gallops Respiratory- bilateral clear to auscultation, no wheeze, no rhonchi, no crackles, no use of accessory muscles Abdomen- bowel sounds present, soft, non tender, no cva tenderness, no suprapubic tenderness Musculoskeletal- able to move all 4 extremities, no leg edema Neurological- no focal deficit, aao x 3 Skin- warm and dry, ecchymoses, bruising noted Psychiatry- normal mood and affect  Labs reviewed: 10-08-13 wbc 13.;2 hgb 12.2; hct 35.6; mcv 91 plt 204 glucose 170; bun 35; creat 1.64; k+4.0; na++137; alk phos 74; ast 54; alt 259; t bili 0.8; albumin 3.0 ptt 73.2 CK 11, 416; CK-MB: 76.6 #2: 55.9; dig 1.6 urine culture: gram neg rods.   10-09-13: wbc 15.6; hgb 11.0;; hct 33.8; mcv 92; plt 174; CK 5306; CK-MB: 39.3   10-10-13: wbc 10.5; hgb 11.0; hct 32.8; mcv 91; plt 168 glucose 153; bun 44; creat 1.59; k+3.2; na++136; mag 1.8   10-11-13: glucose 142; bun 37; creat 1.44; k+3.5; na++142; CK 1256   10-12-13: glucose 139; bun 34; creat 1.31; k+3.8; na++139    Radiological Exams: 10-08-13: left shoulder x-ray: equivocal nondisplaced intra-articular glenoid fracture. Moderate glenohumeral joint degenerative changes.   10-08-13: chest x-ray: no acute cardiopulmonary disease   Assessment/Plan  Physical deconditioning Will have him work with physical therapy and occupational therapy team to help with gait training and muscle strengthening exercises.fall precautions. Skin care. Encourage to be out of bed. Check cbc and cmp in a week  uti Complete course of ciprofloxacin on 10/20/13, encouraged hydration  afib Rate currently controlled. Continue metoprolol 25 mg bid with digoxn 0.25 mg daily. Also on coumadin and aspirin with goal inr 2-3. Last inr supratherapeutic yesterday.will d/c aspirin given his increased fall risk and bleeding risk.  Left glenoid fracture Pain under  control. Continue prn tramadol and prn hydrocodone-apap and monitor  Constipation Colace 100 mg bid and prn miralax from today, monitor clinically  gerd Continue pantoprazole 40 mg daily   Family/ staff Communication: reviewed care plan with patient and nursing supervisor  Goals of care: short term rehabilitation  Labs/tests ordered: cbc, cmp    Blanchie Serve, MD  [facility] [phone] (Monday-Friday 8 am - 5 pm) [phone] (afterhours)

## 2013-10-21 ENCOUNTER — Non-Acute Institutional Stay (SKILLED_NURSING_FACILITY): Payer: Medicare Other | Admitting: Adult Health

## 2013-10-21 DIAGNOSIS — I4891 Unspecified atrial fibrillation: Secondary | ICD-10-CM

## 2013-10-21 DIAGNOSIS — S42142S Displaced fracture of glenoid cavity of scapula, left shoulder, sequela: Secondary | ICD-10-CM

## 2013-10-21 DIAGNOSIS — I482 Chronic atrial fibrillation, unspecified: Secondary | ICD-10-CM

## 2013-10-21 DIAGNOSIS — I6990 Unspecified sequelae of unspecified cerebrovascular disease: Secondary | ICD-10-CM

## 2013-10-21 DIAGNOSIS — I10 Essential (primary) hypertension: Secondary | ICD-10-CM

## 2013-10-21 DIAGNOSIS — S42152S Displaced fracture of neck of scapula, left shoulder, sequela: Secondary | ICD-10-CM

## 2013-10-21 DIAGNOSIS — S42309S Unspecified fracture of shaft of humerus, unspecified arm, sequela: Secondary | ICD-10-CM

## 2013-10-21 NOTE — Progress Notes (Signed)
Patient ID: Samuel Wagner, male   DOB: 1916-07-27, 80 y.o.   MRN: [id number]     [facility]  Allergies  Allergen Reactions  . Sulfa Antibiotics      Chief Complaint  Patient presents with  . Discharge Note    HPI:  He is being discharged to home with home health for pt/ot/rn/aid. He will not need dme. He will need his prescriptions to be written and will need a follow up with his pcp. He had been hospitalized for uti with rhabdomyolysis.   Past Medical History  Diagnosis Date  . A-fib   . Acute renal failure   . DVT (deep venous thrombosis)   . Hypokalemia   . Hypertension   . Prostate cancer   . Stroke   . CAD (coronary artery disease)   . Hyperlipidemia   . Arthritis   . Pulmonary embolism   . Fall at home     Past Surgical History  Procedure Laterality Date  . Appendectomy    . Cholecystectomy    . Hernia repair    . Right eye surgery    . Nasal sinus surgery      VITAL SIGNS BP 126/77  Pulse 62  Ht _0  (1.753 m)  Wt 203 lb (92.08 kg)  BMI 29.96 kg/m2   Patient's Medications  New Prescriptions   No medications on file  Previous Medications   CIPROFLOXACIN (CIPRO) 500 MG TABLET    Take 500 mg by mouth 2 (two) times daily. Until 10/20/13   DIGOXIN (LANOXIN) 0.25 MG TABLET    Take 0.25 mg by mouth daily.   DOCUSATE SODIUM (COLACE) 100 MG CAPSULE    Take 100 mg by mouth 2 (two) times daily.   HYDROCODONE-ACETAMINOPHEN (NORCO/VICODIN) 5-325 MG PER TABLET    Take one tablet by mouth every 6 hours as needed for severe pain   METOPROLOL (LOPRESSOR) 50 MG TABLET    Take 50 mg by mouth 2 (two) times daily.   MULTIPLE VITAMINS-MINERALS (PRESERVISION AREDS) TABS    Take 1 tablet by mouth 2 (two) times daily.   PANTOPRAZOLE (PROTONIX) 40 MG TABLET    Take 40 mg by mouth daily.   POLYETHYLENE GLYCOL (MIRALAX / GLYCOLAX) PACKET    Take 17 g by mouth daily as needed.   SENNOSIDES-DOCUSATE SODIUM (SENOKOT-S) 8.6-50 MG TABLET    Take 1 tablet by mouth 2 (two)  times daily.   TRAMADOL (ULTRAM) 50 MG TABLET    Take 50 mg by mouth every 4 (four) hours as needed.   WARFARIN (COUMADIN) 1 MG TABLET    Take 4 mg by mouth daily at 6 PM. 4.5 mg daily  Modified Medications   No medications on file  Discontinued Medications   ASPIRIN 81 MG TABLET    Take 81 mg by mouth daily.    SIGNIFICANT DIAGNOSTIC EXAMS   10-08-13: left shoulder x-ray: equivocal nondisplaced intra-articular glenoid fracture. Moderate glenohumeral joint degenerative changes.   10-08-13: chest x-ray: no acute cardiopulmonary disease    LABS REVIEWED:  10-08-13 wbc 13.;2 hgb 12.2; hct 35.6; mcv 91 plt 204 glucose 170; bun 35; creat 1.64; k+4.0; na++137; alk phos 74; ast 54; alt 259; t bili 0.8; albumin 3.0 ptt 73.2 CK 11, 416; CK-MB: 76.6 #2: 55.9; dig 1.6 urine culture: gram neg rods.  10-09-13: wbc 15.6; hgb 11.0;; hct 33.8; mcv 92; plt 174; CK 5306; CK-MB: 39.3  10-10-13: wbc 10.5; hgb 11.0; hct 32.8; mcv 91; plt 168 glucose  153; bun 44; creat 1.59; k+3.2; na++136; mag 1.8  10-11-13: glucose 142; bun 37; creat 1.44; k+3.5; na++142; CK 1256  10-12-13: glucose 139; bun 34; creat 1.31; k+3.8; na++139     Review of Systems  Constitutional: Positive for malaise/fatigue. Negative for weight loss.       Has had decreased appetite for the past 2 months without known weight loss  Respiratory: Negative for cough and shortness of breath.   Cardiovascular: Negative for chest pain, palpitations and leg swelling.  Gastrointestinal: Negative for heartburn, abdominal pain and constipation.  Musculoskeletal: Negative for joint pain and myalgias.  Skin: Negative.   Neurological: Negative for headaches.  Psychiatric/Behavioral: Negative for depression.  The patient is not nervous/anxious.      Physical Exam  Constitutional: He is oriented to person, place, and time. He appears well-developed and well-nourished.  Neck: Neck supple. No JVD present. No thyromegaly present.  Cardiovascular: Normal  rate, normal heart sounds and intact distal pulses.   Heart rate irregular   Respiratory: Effort normal and breath sounds normal. No respiratory distress. He has no wheezes.  GI: Soft. Bowel sounds are normal. He exhibits no distension. There is no tenderness.  Musculoskeletal: Normal range of motion. He exhibits no edema.  Neurological: He is alert and oriented to person, place, and time.  Skin: Skin is warm and dry. He is not diaphoretic.  Psychiatric: He has a normal mood and affect.     ASSESSMENT/ PLAN:  Will discharge to home with home health for pt/ot to improve upon gait; mobility and independence with adls; rn for inr management; aid for adl care. He will not need any dme. His prescriptions have been written with vicodin 5/325 mg #30 tabs and ultram 50 mg #30 tabs. He has a follow up appointment with Dr. Natividad Brood on 11-04-13 at 4 pm.    Time spent with patient 40 minutes.

## 2013-11-06 DIAGNOSIS — S42309A Unspecified fracture of shaft of humerus, unspecified arm, initial encounter for closed fracture: Secondary | ICD-10-CM | POA: Insufficient documentation

## 2014-04-08 ENCOUNTER — Telehealth: Payer: Self-pay | Admitting: *Deleted

## 2014-04-08 NOTE — Telephone Encounter (Signed)
Spoke with pt, he states he  Has never been seen by Dr Silvio Pate.

## 2014-05-14 NOTE — H&P (Signed)
PATIENT NAME:  Samuel Wagner, Samuel Wagner MR#:  [id number] DATE OF BIRTH:  08/09/1916  DATE OF ADMISSION:  06/21/2012  PRIMARY CARE PHYSICIAN:  Dr. Ola Spurr from [facility].   REFERRING PHYSICIAN:  Dr. Lurline Hare.   CHIEF COMPLAINT:  Syncope.   HISTORY OF PRESENT ILLNESS:  Samuel Wagner is a 81 year old pleasant Caucasian male who lives at home independently, recently developed acute sinusitis symptoms with nasal congestion and some discharge from his eyes in the form of some conjunctivitis.  He was taking a bath and while he was at bathroom trying to reach clothes he lost his balance and fell injuring his left arm with multiple bruises and skin lacerations.  The patient was brought to the hospital for evaluation.  Upon further interviewing the patient he did not have a true loss of consciousness.  CAT scan of the head was negative, except for findings of sinusitis.  This is pan sinusitis.  No acute intracranial abnormality.  Lab work-up showed mild elevation of troponin at 0.06.  The patient does not have any chest pain.  No shortness of breath.  No symptoms and this is incidental finding.  The patient was admitted for 24-hour observation on telemetry and follow-up on cardiac enzymes.   REVIEW OF SYSTEMS:  CONSTITUTIONAL:  Denies any fever.  No chills.  No fatigue.  EYES:  No blurring of vision.  No double vision other than his macular degeneration, although he did not reach the face of being legally blind.  He has some vision.  EARS, NOSE, THROAT:  No hearing impairment.  No sore throat, but he has congestion of the sinuses, started recently.  CARDIOVASCULAR:  No chest pain.  No shortness of breath.  No palpitations.  RESPIRATORY:  No cough.  No sputum production.  No chest pain.  No shortness of breath.  GASTROINTESTINAL:  No abdominal pain.  No vomiting.  No diarrhea.  GENITOURINARY:  No dysuria.  No frequency of urination.  MUSCULOSKELETAL:  No joint pain or swelling other than the tenderness on  the left arm skin lacerations.  No muscular swelling other than the hematoma on the left arm.  INTEGUMENTARY:  He had skin lacerations on the left forearm at multiple sites.  No rash.  NEUROLOGY:  No focal weakness.  No seizure activity.  No headache.  PSYCHIATRY:  No anxiety.  No depression.  ENDOCRINE:  No polyuria or polydipsia.  No heat or cold intolerance.   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  This is chronic.  This is since his stroke.  Vestibulopathy.  Coronary artery disease.   PAST SURGICAL HISTORY:  Bilateral surgeries to the eyes.  Bilateral hernia repair in 1992.  Appendectomy in 91.  Sinus surgery in 1990.  Laparoscope cholecystectomy in 2001.  Laser surgery to the left eye.   FAMILY HISTORY:  His son died in his 44s from brain cancer.  His mother died from stroke at age of 91.  Father suffered from stroke and prostate cancer and died at age of 42.  A sister is healthy.   SOCIAL HABITS:  Former smoker.  He quit in 1958.  He drinks alcohol only occasionally.   SOCIAL HISTORY:  He is widowed.  Lives at  home independently.  He retired from working with [employer] as a Therapist, music.   ADMISSION MEDICATIONS:  Aspirin 81 mg a day, digoxin 0.25 mg once a day, Protonix 40 mg a day, one vitamin a day.  Coumadin 5 mg daily except Tuesday taking 2.5 mg.  Metoprolol 25 mg twice a day.   ALLERGIES:  SULFA CAUSING A SKIN RASH.   PHYSICAL EXAMINATION: VITAL SIGNS:  Blood pressure 142/68, pulse 83, respiratory rate 22, temperature 98.9, oxygen saturation 95%.  GENERAL APPEARANCE:   Elderly male, lying in bed in no acute distress.  HEAD AND NECK:  No pallor.  No icterus.  No cyanosis.  EARS, NOSE, THROAT:  Ear examination revealed slightly impaired hearing.  No discharge, no lesions.  Nasal mucosa appears slightly congested.  No discharge.  No bleeding.  Oropharyngeal area showed normal lips and tongue.  No oral thrush, no exudate, no ulcers.  EYES:  Examination revealed normal eyelids.  The conjunctivae are slightly congested.  No discharge.  Both pupils are constricted.  I could not see reactivity to light.  NECK:  Supple.  Trachea at midline.  No thyromegaly.  No cervical lymphadenopathy.  No masses.  HEART:  Revealed slightly irregular S1, S2.  No S3 or S4.  No murmur.  No gallop.  No carotid bruits.  RESPIRATORY:  Revealed normal breathing pattern without use of accessory muscles.  No rales.  No wheezing.  ABDOMEN:  Soft without tenderness.  No hepatosplenomegaly.  No masses.  SKIN:  Revealed no cellulitis.  No ulcers except for the left arm.  He has multiple bruises and skin lacerations and a hematoma.  MUSCULOSKELETAL:  No joint swelling.  No clubbing.  NEUROLOGIC:  Cranial nerves II through XII are intact.  No focal motor deficit.  He moves four extremities without any weakness.   PSYCHIATRIC:  The patient is alert, oriented x 3.  Mood and affect were normal.   LABORATORY DATA:  X-ray of the left arm showed no evidence of fracture.  CAT scan of the head showed no acute intracranial abnormalities, however there is evidence of pan sinusitis and chronic microangiopathy changes.  Chest x-ray showed no acute cardiopulmonary abnormalities.  EKG showed atrial fibrillation with a ventricular rate at 100 per minute.  ST depression in the anterolateral leads.  He had similar changes in the past compared to the old EKG, but maybe only a little variation.  This is compared to September 2012 EKG.  Serum glucose 205, BUN 21, creatinine 1.2, sodium 136, potassium 3.6.  Normal liver  function tests except albumin was slightly low at 3.2.  Normal liver transaminases.  Total CPK 193.  Troponin was 0.06 with normal CK-MB fraction 2.6.  CBC showed a white count of 16,000, hemoglobin 12, hematocrit 37, platelet count 220.  Prothrombin time 22, INR 2.1.  APTT 51.  Urinalysis was unremarkable.   ASSESSMENT: 1.  Syncope without true loss of consciousness.  It appears to be mostly secondary to loss of balance.  He has multiple reasons for that given his chronic dizziness and then the acute sinusitis worsening his symptoms.  He has poor vision due to his macular degeneration.  He has history of stroke and atrial fibrillation so the tendency to fall is there.   2.  Incidental finding of elevated troponin.  This is marginal elevation.  We suggest to follow up on that.  3.  Abnormal EKG.  However, these are subtle changes compared to the old EKG, not impressive to  say dramatic change.  4.  Acute sinusitis.  5.  Macular degeneration.  6.  Chronic atrial fibrillation.  7.  Chronic dizziness.  8.  History of stroke.  9.  Systemic hypertension.  10.  History of prostatic cancer.   PLAN:  We will admit the patient as observation to telemetry.  We will follow up on the cardiac enzymes.  IV Rocephin was given for his sinusitis.  Wound care for his left arm.  Continue home medications as listed above.  The patient received a dose of tetanus for his injuries as a booster dose.  The patient does have a LIVING WILL and his CODE STATUS is FULL CODE unless he has irreversible condition, then he does not want to be maintained on life support indefinitely.   Time spent in evaluating this patient took more than 55 minutes.     ____________________________ Clovis Pu. Lenore Manner, MD amd:ea D: 06/22/2012 02:40:22 ET T: 06/22/2012 03:09:00 ET JOB#: [id number]  cc: Clovis Pu. Lenore Manner, MD, <Dictator> Mike Craze Irven Coe MD ELECTRONICALLY SIGNED 06/23/2012 1:09

## 2014-05-14 NOTE — Discharge Summary (Signed)
PATIENT NAME:  Samuel Wagner, Samuel Wagner MR#:  [id number] DATE OF BIRTH:  [birth year]  DATE OF ADMISSION:  06/22/2012 DATE OF DISCHARGE:  06/24/2012  DISCHARGE DIAGNOSES: 1.  Fall with left arm injury.  2.  Atrial fibrillation.  3.  Leukocytosis.  4.  Sinusitis.   HISTORY OF PRESENT ILLNESS: Please see admission history and physical from June 1st.  Briefly, this is a 81 year old gentleman who lives at home alone. He had developed sinusitis symptoms and conjunctivitis. He lost his balance at home and fell with multiple bruises and lacerations. He was brought to the ED for evaluation. CT scan done there showed pansinusitis and mild elevation of his troponins.   HOSPITAL COURSE BY ISSUE:  1.  Follow-up.  The patient had imaging done without any evidence of fractures or any intracranial process, except pansinusitis. He was treated with pain medications. X-rays of his chest and his forearm were negative.  2.  Sinusitis. The patient was treated with ceftriaxone and transitioned to oral antibiotics. He had improvement in his symptoms.  3.  A-fib with chronic ST changes. He remained on Coumadin. Troponin was mildly elevated. He was continued on rate control.  4.  Debility. The patient was seen by physical therapy who evaluated him and recommended home health.   DISCHARGE MEDICATIONS: 1.  Digoxin 250 mg once a day.  2.  Metoprolol 25 mg twice a day.  3.  Aspirin 81 mg once a day.  4.  Protonix 40 mg once a day.  5.  Coumadin 5 mg every day except 1/2 tab on Tuesdays.  6.  Celebrex 200 mg once a day.  7.  Acetaminophen.  8.  Augmentin 875 mg twice a day.  9.  Tramadol 50 mg q. 4 hours p.r.n.  10.  Ciprofloxacin eyedrops 1 drop into affected eye q. 6 hours.  11.  Colace 100 mg twice a day.   DISCHARGE DISPOSITION:  To home with home physical therapy.   DISCHARGE DIET: Low sodium, regular consistency.   ACTIVITY LIMITATIONS: As tolerated.   DISCHARGE FOLLOWUP:  With Dr. Ola Spurr in 1 to 2  weeks.  TIME SPENT:  35 minutes. ____________________________ Cheral Marker. Ola Spurr, MD dpf:sb D: 06/26/2012 13:31:52 ET T: 06/26/2012 14:22:07 ET JOB#: [id number]  cc: Cheral Marker. Ola Spurr, MD, <Dictator> Vanisha Whiten Ola Spurr MD ELECTRONICALLY SIGNED 06/28/2012 23:07

## 2014-05-15 NOTE — Discharge Summary (Signed)
PATIENT NAME:  Samuel Wagner, Samuel Wagner MR#:  [id number] DATE OF BIRTH:  04/28/1916  DATE OF ADMISSION:  10/08/2013 DATE OF DISCHARGE:  10/12/2013   ADMISSION DIAGNOSES:  1. Fall.  2. Urinary tract infection.  3. Rhabdomyolysis.   DISCHARGE DIAGNOSES:  1. Escherichia coli sepsis with bacteremia secondary to urinary tract infection.  2. Fall with weakness secondary to problem number 1.  3. Rhabdomyolysis.  4. Elevated troponin due to sepsis and rhabdomyolysis.  5. Left glenoid fracture.  6. Chronic atrial fibrillation.  7. Acute renal failure, improved.   CONSULTATIONS:  1. Dr. Rudene Christians.  2. Dr. Ola Spurr.  3. Dr. Saralyn Pilar, cardiology.   PERTINENT LABORATORIES: Blood and urine cultures positive for Escherichia coli, which is pansensitive.   Sodium 139, potassium 3.8, chloride 106, bicarbonate 26, BUN 34, creatinine 1.3, glucose is 139.   Troponin 0.20 at discharge. Troponin maximum 0.40.   CK 1256.   White blood cells 10.5, hemoglobin 11, hematocrit 33, platelets are 168,000.   INR is 2.3.   HOSPITAL COURSE: A 81 year old male, who presented to the ER after having a fall, noted to have a urinary tract infection. For further details, please refer to the history and physical.   1. Sepsis with Escherichia coli bacteremia, secondary to urinary tract infection. The patient was placed on antibiotics for his urinary tract infection. Blood and urine cultures were obtained in the ER. He was ruled in for sepsis secondary to Escherichia coli bacteremia, as his blood cultures and urine culture was positive for Escherichia coli, suspected bacteremia secondary to his urinary tract infection. He was initially placed on Zosyn, changed to Rocephin. ID was consulted. His CBC has normalized. He is afebrile, doing quite and we will discharge him with ciprofloxacin for a total of 2 weeks of therapy.  2. Rhabdomyolysis secondary to a fall, which is improved with IV fluids.  3. Elevated troponin due to sepsis  and rhabdomyolysis. Cardiology was consulted. Dr.  Saralyn Pilar did not feel this was a non-STEMI, and this was, in fact, due to demand ischemia from the sepsis and rhabdomyolysis.  4. Left glenoid fracture after a fall. We appreciate Dr. Theodore Demark consult. The patient will need pain control and physical therapy with rehabilitation.  5. Chronic atrial fibrillation, history of DVT. The patient is higher fall risk; however, he has had DVT when he been off of Coumadin. The family has decided to continue with Coumadin. INR should be between 2 and 3, which should be checked daily.  6. Acute renal failure secondary to sepsis and ATN, which is improved with IV fluids. 7. Hypokalemia, which is improved.   DISCHARGE MEDICATIONS:  1. Digoxin 0.25 mg daily. 2. Metoprolol 25 mg b.i.d.  3. Aspirin 81 mg daily. 4. Tramadol 50 mg q.4 h. p.r.n. pain.  5. Pantoprazole 40 mg daily.  6. PreserVision 2 tablets b.i.d.  7. Refresh 1 application each affected eye at bedtime.  8. Coumadin 4.5 mg daily.  9. Ciprofloxacin 500 mg q.12 h. x 9 days.  10. Acetaminophen hydrocodone 325/5 q.6 h. p.r.n. pain.   DISCHARGE DIET: Low sodium.   DISCHARGE ACTIVITY: As tolerated.   DISCHARGE REFERRAL: Physical therapy.   DISCHARGE FOLLOW-UP: The patient needs to follow up with Dr. Sherald Barge in 1 week. The patient needs a daily INR as the patient is on antibiotics and Coumadin.   TIME SPENT: Approximately 35 minutes.   The plan of care was discussed with the patient and family.    ____________________________ Donell Beers. Benjie Karvonen, MD spm:JT  D: 10/12/2013 11:29:00 ET T: 10/12/2013 11:50:04 ET JOB#: [id number]  cc: Malachy Mood L. Gorden Harms, MD Nataliya Graig P. Benjie Karvonen, MD, <Dictator>  Donell Beers Mykeria Garman MD ELECTRONICALLY SIGNED 10/12/2013 15:08

## 2014-05-15 NOTE — Consult Note (Signed)
Brief Consult Note: Diagnosis: Bacteremia due to GNR, UTI, Prostate cancer.   Patient was seen by consultant.   Consult note dictated.   Recommend further assessment or treatment.   Orders entered.   Comments: Change ceftriaxone to zosyn until further ID of organism 10 day total course of abx as appropriate.  Electronic Signatures: Angelena Form (MD)  (Signed 18-Sep-15 14:58)  Authored: Brief Consult Note   Last Updated: 18-Sep-15 14:58 by Angelena Form (MD)

## 2014-05-15 NOTE — Consult Note (Signed)
Brief Consult Note: Diagnosis: left shoulder osteoarthritis.   Patient was seen by consultant.   Comments: ok to bear weight on left shoulder.  Electronic Signatures: Laurene Footman (MD)  (Signed 18-Sep-15 13:44)  Authored: Brief Consult Note   Last Updated: 18-Sep-15 13:44 by Laurene Footman (MD)

## 2014-05-15 NOTE — Consult Note (Signed)
PATIENT NAME:  Samuel Wagner, Samuel Wagner MR#:  [id number] DATE OF BIRTH:  May 20, 1916  DATE OF CONSULTATION:  10/11/2013  REFERRING PHYSICIAN:   CONSULTING PHYSICIAN:  Isaias Cowman, MD  PRIMARY CARE PHYSICIAN:  Adrian Prows, M.D.  CHIEF COMPLAINT: "I fell."  HISTORY OF PRESENT ILLNESS: The patient is a 81 year old gentleman with history of atrial fibrillation. The patient presented to [facility] Emergency Room after falling. The patient reports that he fell twice, had difficulty getting to his feet. Admission labs were notable for a BUN and creatinine of 35 and 1.64, respectively. Total CK of 11,416 with MB fraction of 76.6, with a minimal elevation in troponin of 0.28. The patient denies chest pain. EKG does not show any acute ischemic ST-T wave changes.  Urinalysis also revealed probable urinary tract infection with elevated white count consistent with urosepsis.   PAST MEDICAL HISTORY:  1.  Atrial fibrillation, on Coumadin.  2.  Hypertension.  3.  History of left-sided stroke. 4.  History of prostate cancer.  5.  Osteoarthritis.   MEDICATIONS: Coumadin 5 mg daily, metoprolol 25 mg b.i.d., digoxin 0.25 mg b.i.d., aspirin 81 mg daily, tramadol 50 mg q. 4 hours p.r.n., Refresh applications both eyes, PreserVision, Protonix 40 mg daily.   SOCIAL HISTORY: The patient apparently lives by himself. He denies tobacco abuse.   FAMILY HISTORY: No immediate family history of coronary artery disease or myocardial infarction.   REVIEW OF SYSTEMS:   CONSTITUTIONAL: No fever or chills.  EYES: No blurry vision.  EARS: No hearing loss.  RESPIRATORY: No shortness of breath.  CARDIOVASCULAR: The patient has occasional chest discomfort.  GASTROINTESTINAL: No nausea, vomiting, or diarrhea.  GENITOURINARY: No dysuria or hematuria.  ENDOCRINE: No polyuria or polydipsia.  MUSCULOSKELETAL: No arthralgias or myalgias.  NEUROLOGICAL: No focal muscle weakness or numbness. The patient has had a prior stroke.    PHYSICAL EXAMINATION:  VITAL SIGNS: Blood pressure 156/73, pulse 96, respirations 20, temperature 99.5, pulse oximetry 96%.  HEENT: Pupils equal and reactive to light and accommodation.  NECK: Supple without thyromegaly.  LUNGS: Clear.  CARDIOVASCULAR:  Normal JVP. Normal PMI. Irregularly irregular rhythm. Normal S1, S2. No appreciable gallop, murmur, or rub.  ABDOMEN: Soft and nontender. Pulses were intact bilaterally.  MUSCULOSKELETAL: Normal muscle tone.  NEUROLOGIC: The patient is alert and oriented x 3. Motor and sensory both grossly intact.   IMPRESSION: A 81 year old gentleman who presents after falling and having difficulty standing with markedly elevated CPK-MB, total CK consistent with rhabdomyolysis. The patient has borderline elevated troponin likely due to demand supply ischemia and not due to acute coronary syndrome.   RECOMMENDATIONS:  1.  Agree with overall current therapy.  2.  Would defer full dose anticoagulation. 3.  Evaluate ability to walk per physical therapy. Pending evaluation if the patient is at high risk for repeat falls then may consider discontinuing warfarin.  4.  If the patient stays on warfarin then would discontinue aspirin. If warfarin is discontinued, then would continue aspirin.  5.  No further cardiac diagnostics at this time.   ____________________________ Isaias Cowman, MD ap:nr D: 10/11/2013 09:54:28 ET T: 10/11/2013 10:52:39 ET JOB#: [id number]  cc: Isaias Cowman, MD, <Dictator> Cheral Marker. Ola Spurr, MD Isaias Cowman MD ELECTRONICALLY SIGNED 10/20/2013 12:16

## 2014-05-15 NOTE — Consult Note (Signed)
PATIENT NAME:  CHRISTY, FRIEDE MR#:  [id number] DATE OF BIRTH:  [birth year]  DATE OF CONSULTATION:  10/09/2013.  CONSULTING PHYSICIAN:  Laurene Footman, MD.  REASON FOR CONSULTATION: Possible left glenoid fracture.   HISTORY OF PRESENT ILLNESS: The patient is a 81 year old who suffered multiple falls. He is having significant pain in his left shoulder. X-rays were obtained and there is question for a possible fracture. He reports shoulder pain with motion above about 90 degrees.  On exam he can be passively put through a range of motion to 120 degrees flexion and 110 degrees abduction and beyond that point he gets severe pain. He is able to press down without pain in the left shoulder with the arm against his chest and against his side. His x-rays were reviewed and there is significant degenerative osteoarthritis present to the left glenohumeral joint. I think the lucency noted on prior x-ray is probably related to osteoarthritic changes and bone vascularity. I do not think there is an acute fracture of the glenoid based on his examination and x-ray findings.   CLINICAL IMPRESSION: Significant osteoarthritis, left shoulder, but without significant pain with weight bearing on examination. I think he is okay to use a walker on the left upper extremity, no further treatment is required.    ____________________________ Laurene Footman, MD mjm:lt D: 10/11/2013 10:10:04 ET T: 10/11/2013 11:09:20 ET JOB#: [id number]  cc: Laurene Footman, MD, <Dictator> Laurene Footman MD ELECTRONICALLY SIGNED 10/11/2013 13:25

## 2014-05-15 NOTE — H&P (Signed)
PATIENT NAME:  Samuel Wagner, Samuel Wagner MR#:  [id number] DATE OF BIRTH:  03/30/1916  DATE OF ADMISSION:  10/08/2013  PRIMARY CARE PHYSICIAN: Dr. Ola Spurr.   PRIMARY CARE PHYSICIAN: Dr. Edd Fabian.   CHIEF COMPLAINT: Fall.    HISTORY OF PRESENT ILLNESS: Mr. Fazzino is a 81 year old male with a history of chronic atrial fibrillation on anticoagulation with Coumadin, hypertension, hyperlipidemia, osteoarthritis, developed a pulmonary embolism and had to be restarted on Coumadin. Had a fall yesterday morning. However, during the daytime, the patient continued to feel worse. Concerning this, the patient is brought to the Emergency Department. Work-up in the Emergency Department, the patient is found to have high urinary tract infection with 3+ leukocyte esterase, WBC of 937, bacteria 2+. The patient is also found to have  CK of 11,416, CK-MB of 776. Troponin of 0.40. X-ray of the left shoulder shows    nondisplaced intra-articular glenoid fracture.   PAST MEDICAL HISTORY:  1. Chronic atrial fibrillator fibrillation on chronic anticoagulation.  2. Hypertension.  3. History of prostate cancer.   4. Osteoarthritis.  5. Rosacea.  6. Previous history of stroke with left-sided residual weakness.  7. History of   ulcer.  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  9. Vestibulopathy.  10. Coronary artery disease.   PAST SURGICAL HISTORY:  1. Bilateral surgeries to the eyes.  2. Bilateral hernia repair.  3. Appendectomy.  4. Sinus surgery.  5. Laparoscopic cholecystectomy in 2001.   ALLERGIES: SULFA CAUSING A SKIN RASH.   HOME MEDICATIONS:  1. Tramadol 50 mg every 4 hours as needed.  2. RepHresh applications to each eye.   3. PreserVision.  4. Protonix 40 mg daily.  5. Metoprolol 25 mg b.i.d.  6. Digoxin 250 mcg 2 times daily.  7. Coumadin 5 mg once a day.  8. Aspirin 81 mg daily.   SOCIAL HISTORY: No history of smoking, drinking alcohol or using illicit drugs. Lives by himself   FAMILY HISTORY: Son died  in his 88s from brain cancer. Mother died from stroke at the age of 44. Father suffered from stroke and prostate cancer, died at the age of 14.   REVIEW OF SYSTEMS:  CONSTITUTIONAL: Experiencing generalized weakness.  EYES: No change in vision.  EARS, NOSE AND THROAT:  No change in hearing.  RESPIRATORY: Has no cough, shortness of breath.  CARDIOVASCULAR: No chest pain, palpitations.  GASTROINTESTINAL: No nausea, vomiting, abdominal pain.  GENITOURINARY: No dysuria or hematuria.  HEMATOLOGIC: No easy bruising or bleeding.  SKIN: Has mild lacerations from this fall.  MUSCULOSKELETAL: Has osteoarthritis. NEUROLOGIC: No weakness or numbness in any part of the body.   PHYSICAL EXAMINATION:  GENERAL: This is a well-built, well-nourished, age-appropriate male lying down in the bed, not in distress.  VITAL SIGNS: Temperature 99.6, pulse 87, blood pressure 141/63, respiratory rate of 20, oxygen saturation 95% on room air.  HEENT: Head normocephalic, atraumatic. There is no scleral icterus. Conjunctivae normal. Pupils equal and reactive. Mucous membranes moist. No pharyngeal erythema.  NECK: Supple. No lymphadenopathy. No JVD. No carotid bruit.  CHEST: Has no focal tenderness.  LUNGS: Bilateral clear to auscultation.  HEART: S1 and S2 regular. No murmurs are heard.  ABDOMEN: Bowel sounds present. Soft, nontender, nondistended.  EXTREMITIES: Left shoulder has significant tenderness with palpation. NEUROLOGIC: No apparent cranial nerve abnormalities.  SKIN: No rash. Has a few lacerations on the forehead.    LABORATORY DATA: CT head without contrast: No acute intracranial abnormality. CT cervical spine without contrast: No factors are  seen. Chest x-ray, one view portable: No acute cardiopulmonary disease.   Left shoulder with moderate glenohumeral joint degenerative changes.   Troponin 0.40. CBC: WBC of 13, hematocrit 10.8, hemoglobin 12, platelet count of 204,000. BMP: BUN 35, creatinine of  1.64. Rest of all of the values are within normal limits.   Urinalysis 3+ leukocyte esterase. WBC of 937, bacteria 2+.   CK 11,500.   ASSESSMENT AND PLAN: Mr. Lares is a 81 year old male who comes after having a fall.   1. Fall. The patient states mechanical, however, admit the patient to a monitored bed continue to follow up if the patient has any arrhythmias. Also cycle cardiac enzymes x 3.  2. Urinary tract infection. Urine cultures have been sent. Continue with Rocephin.  3. Rhabdomyolysis. The patient has elevated CK of 11,500. Continue with intravenous fluids and follow up.  4. Mild non-ST elevation myocardial infarction. This seems to more from the rhabdomyolysis. We will continue to follow up, the patient experiences chest pain on and off.  5. Debility. Involve the physical therapy, occupational therapy.  6. Left glenoid fracture. Consult orthopedic surgery.  7. History of deep vein thrombosis. Currently INR is therapeutic at 3.   8. Keep the patient on deep vein thrombosis prophylaxis with sequential compression devices.   TIME SPENT: 50 minutes.    ____________________________ Monica Becton, MD pv:JT D: 10/09/2013 00:50:05 ET T: 10/09/2013 01:14:02 ET JOB#: [id number]  cc: Monica Becton, MD, <Dictator> Monica Becton MD ELECTRONICALLY SIGNED 10/21/2013 21:34

## 2014-05-15 NOTE — Consult Note (Signed)
PATIENT NAME:  Samuel, Wagner MR#:  [id number] DATE OF BIRTH:  1916-05-07  DATE OF CONSULTATION:  10/09/2013  REQUESTING PHYSICIAN:  Dr. Bridgett Larsson    CONSULTING PHYSICIAN:  Cheral Marker. Ola Spurr, MD  REASON FOR CONSULTATION:  Urinary tract infection, sepsis and gram-negative bacteremia.   HISTORY OF PRESENT ILLNESS: This is a pleasant 81 year old gentleman with history of atrial fibrillation as well as prostate cancer who follows with Dr. Jacqlyn Larsen.  He has had issues with urinary symptoms off and on.  He is on Lupron intermittently for his prostate cancer and has had radiation in the past.  He was admitted following a fall.  He was found on admission to have a positive urinalysis with 937 white cells, as well as to be in rhabdomyolysis.  He was started on antibiotics.  Blood cultures are positive for gram-negative rods and his urine culture is also positive for gram-negative rods.  We are consulted for further medication management.   PAST MEDICAL HISTORY:  1.  Atrial fibrillation.  2.  Hypertension.  3.  Prostate cancer status post radiation therapy and Lupron following with urology.  4.  Osteoarthritis.  5.  Rosacea.  6.  Prior CVAs.  7.  Chronic iritis.  8.  Radiculopathy.  9.  Coronary artery disease.   PAST SURGICAL HISTORY: Eye surgery, hernia repair, appendectomy, sinus surgery, laparoscopic cholecystectomy.   FAMILY HISTORY: Noncontributory.   SOCIAL HISTORY: Lives by himself.  No tobacco, alcohol or drugs.   REVIEW OF SYSTEMS:  Eleven systems reviewed and negative except as per history of present illness.   ANTIBIOTICS SINCE ADMISSION: Include ceftriaxone.   ALLERGIES: SULFA DRUGS.   PHYSICAL EXAMINATION:  VITAL SIGNS: Temperature 97.9, pulse 88, blood pressure 109/57, respirations 20, sat 96%.  GENERAL: He is lying in bed in no acute distress.  He is frail appearing.  HEENT: Pupils equal, round, and reactive to light and accommodation.  Extraocular movements are intact.  Sclerae  are anicteric.  OROPHARYNX: Clear. His mucous membranes are dry.  NECK: Supple. No anterior cervical, posterior cervical or supraclavicular lymphadenopathy.  HEART: Irregular.  LUNGS: Clear to auscultation bilaterally.  ABDOMEN: Soft, nontender, nondistended.  EXTREMITIES: 1+ edema bilaterally.  NEUROLOGIC: He is awake and interactive, nonfocal neuro exam.   LABORATORY DATA: White count on admission was 13.2, currently 15.6.  Hemoglobin 11.0, platelets 174,000.  INR was 3.0.  Urine culture greater than 100,000 gram-negative rods.  Blood cultures 2 of 2 gram-negative rods.  Urinalysis on admission 3+ leukocyte esterase 937 white blood cell clumps present.  Rhabdomyolysis lab showed a CK of 11,000 on admission, down to 5000 currently.  AST was also elevated presumably due to rhabdomyolysis at 259.  Renal function shows a creatinine of 1.55, BUN of 41.    IMAGING:  Chest x-ray showed no active cardiopulmonary disease.   IMPRESSION:  A 81 year old gentleman with history of prostate cancer, as well as atrial fibrillation and prior pulmonary embolus admitted with a fall and found to have a urinary tract infection as well as gram-negative bacteremia.  Currently, he has been on ceftriaxone.  He has been afebrile and relatively hemodynamically stable, but his white count has increased.   RECOMMENDATIONS:  1.  Discontinue ceftriaxone.  2.  Start Zosyn for broader coverage until identification of the organism.  3.  I would recommend a 10 day treatment course for this urinary tract infection and bacteremia based on final results.  Hopefully, it will be sensitive to an oral agent and he can be  discharged on that once he is clinically improving.  4.  He will need follow-up with his urologist given the urinary source of this and his history of prostate cancer.   Thank you for the consult. I will be glad to follow with you.     ____________________________ Cheral Marker. Ola Spurr, MD dpf:DT D: 10/09/2013  14:57:52 ET T: 10/09/2013 15:26:44 ET JOB#: [id number]  cc: Cheral Marker. Ola Spurr, MD, <Dictator> DAVID Ola Spurr MD ELECTRONICALLY SIGNED 10/25/2013 23:53

## 2014-09-15 ENCOUNTER — Ambulatory Visit: Payer: Self-pay | Admitting: Urology

## 2014-09-21 ENCOUNTER — Encounter: Payer: Self-pay | Admitting: Urology

## 2014-10-01 ENCOUNTER — Ambulatory Visit (INDEPENDENT_AMBULATORY_CARE_PROVIDER_SITE_OTHER): Payer: Medicare Other | Admitting: Urology

## 2014-10-01 ENCOUNTER — Encounter: Payer: Self-pay | Admitting: *Deleted

## 2014-10-01 VITALS — BP 138/74 | HR 60 | Ht 72.0 in | Wt 193.4 lb

## 2014-10-01 DIAGNOSIS — Z8546 Personal history of malignant neoplasm of prostate: Secondary | ICD-10-CM | POA: Diagnosis not present

## 2014-10-01 DIAGNOSIS — I10 Essential (primary) hypertension: Secondary | ICD-10-CM | POA: Insufficient documentation

## 2014-10-01 DIAGNOSIS — N3281 Overactive bladder: Secondary | ICD-10-CM

## 2014-10-01 DIAGNOSIS — I2510 Atherosclerotic heart disease of native coronary artery without angina pectoris: Secondary | ICD-10-CM | POA: Insufficient documentation

## 2014-10-01 DIAGNOSIS — E785 Hyperlipidemia, unspecified: Secondary | ICD-10-CM | POA: Insufficient documentation

## 2014-10-01 NOTE — Progress Notes (Signed)
10/01/2014 12:45 PM   Samuel Wagner 03-12-1916 [id number]  Referring provider: Venia Carbon, MD [place], [place] [address]  Chief Complaint  Patient presents with  . Prostate Cancer    HPI: Known prostatic cancer overactive bladder no response to anticholinergics. No recent cystoscopy. PSAs have been or and a 95 him that really anxious to be doing PSAs or giving this patient Lupron. Still remains alert sharp and oriented to time as all the problem of his poor response to anticholinergics and an overactive bladder. He does leak at night further reason for leakage may be that he has peripheral edema.    PMH: Past Medical History  Diagnosis Date  . A-fib   . Acute renal failure   . DVT (deep venous thrombosis)   . Hypokalemia   . Hypertension   . Prostate cancer   . Stroke   . CAD (coronary artery disease)   . Hyperlipidemia   . Arthritis   . Pulmonary embolism   . Fall at home   . Rectal incontinence   . OAB (overactive bladder)   . Arrhythmia   . Nocturia   . Urge incontinence   . Neoplasm of uncertain behavior of kidney   . Acute cystitis   . Incomplete bladder emptying   . Rosacea   . Peripheral neuropathy   . DJD (degenerative joint disease)   . Vertigo   . Shingles   . CVD (cardiovascular disease)     Surgical History: Past Surgical History  Procedure Laterality Date  . Appendectomy    . Cholecystectomy    . Hernia repair    . Right eye surgery    . Nasal sinus surgery    . Cataract extraction Bilateral 1995    Home Medications:    Medication List       This list is accurate as of: 10/01/14 12:45 PM.  Always use your most recent med list.               aspirin 81 MG tablet  Take 81 mg by mouth.     digoxin 0.25 MG tablet  Commonly known as:  LANOXIN  Take 0.25 mg by mouth daily.     docusate sodium 100 MG capsule  Commonly known as:  COLACE  Take 100 mg by mouth 2 (two) times daily.     HYDROcodone-acetaminophen 5-325 MG per tablet  Commonly known as:  NORCO/VICODIN  Take one tablet by mouth every 6 hours as needed for severe pain     ICAPS AREDS 2 Caps  Take by mouth.     metoprolol tartrate 25 MG tablet  Commonly known as:  LOPRESSOR  TAKE 1 TABLET (25 MG TOTAL) BY MOUTH 2 (TWO) TIMES DAILY.     Omega 3 1000 MG Caps  Take by mouth.     pantoprazole 40 MG tablet  Commonly known as:  PROTONIX  Take 40 mg by mouth daily.     traMADol 50 MG tablet  Commonly known as:  ULTRAM  Take 50 mg by mouth every 4 (four) hours as needed.     warfarin 1 MG tablet  Commonly known as:  COUMADIN  Take 4 mg by mouth daily at 6 PM. 4.5 mg daily     warfarin 5 MG tablet  Commonly known as:  COUMADIN  TAKE 1 TABLET (5 MG TOTAL) BY MOUTH ONCE DAILY.     warfarin 4 MG tablet  Commonly known as:  COUMADIN  Take 4 mg by mouth daily.        Allergies:  Allergies  Allergen Reactions  . Sulfa Antibiotics Rash    Other reaction(s): RASH  . Sulfacetamide Sodium Rash    Family History: Family History  Problem Relation Age of Onset  . Stroke Mother   . Stroke Father   . Cancer Father     Prostate  . Cancer Son     prostate    Social History:  reports that he has quit smoking. He does not have any smokeless tobacco history on file. He reports that he does not drink alcohol or use illicit drugs.  ROS:                                        Physical Exam: BP 138/74 mmHg  Pulse 60  Ht 6' (1.829 m)  Wt 193 lb 6.4 oz (87.726 kg)  BMI 26.22 kg/m2  Constitutional:  Alert and oriented, No acute distress. HEENT: [place] AT, moist mucus membranes.  Trachea midline, no masses. Cardiovascular: No clubbing, cyanosis, or edema. Respiratory: Normal respiratory effort, no increased work of breathing. GI: Abdomen is soft, nontender, nondistended, no abdominal masses GU: No CVA tenderness.  Skin: No rashes, bruises or suspicious lesions. Lymph: No cervical  or inguinal adenopathy. Neurologic: Grossly intact, no focal deficits, moving all 4 extremities. Psychiatric: Normal mood and affect.  Laboratory Data: Lab Results  Component Value Date   WBC 10.5 10/10/2013   HGB 11.0* 10/10/2013   HCT 32.8* 10/10/2013   MCV 91 10/10/2013   PLT 168 10/10/2013    Lab Results  Component Value Date   CREATININE 1.31* 10/12/2013    No results found for: PSA  No results found for: TESTOSTERONE  No results found for: HGBA1C  Urinalysis    Component Value Date/Time   COLORURINE Yellow 10/08/2013 1853   APPEARANCEUR Cloudy 10/08/2013 1853   LABSPEC 1.016 10/08/2013 1853   PHURINE 5.0 10/08/2013 1853   GLUCOSEU Negative 10/08/2013 1853   HGBUR 3+ 10/08/2013 1853   BILIRUBINUR Negative 10/08/2013 1853   KETONESUR Trace 10/08/2013 1853   PROTEINUR 100 mg/dL 10/08/2013 1853   NITRITE Negative 10/08/2013 1853   LEUKOCYTESUR 3+ 10/08/2013 1853    Pertinent Imaging: None  Assessment & Plan:  Adenocarcinoma prostate with PSAs 0.1 last 10 years. Has stopped Lupron at patient's request presently has overactive bladder with failure of anticholinergics and beta 3 blockers has not had a recent cystoscopy and he may have outlet obstruction as a cause of his incontinence will make sure there is no obstruction with cystoscopy in the next visit in the next month  There are no diagnoses linked to this encounter.  No Follow-up on file.  Collier Flowers, [facility] [address], [place], [place] [address] [phone]

## 2014-10-27 ENCOUNTER — Other Ambulatory Visit: Payer: Medicare Other | Admitting: Urology

## 2015-08-03 ENCOUNTER — Emergency Department: Payer: Medicare Other

## 2015-08-03 ENCOUNTER — Inpatient Hospital Stay
Admission: EM | Admit: 2015-08-03 | Discharge: 2015-08-08 | DRG: 065 | Disposition: A | Discharge status: Skilled Nursing Facility | Payer: Medicare Other | Attending: Internal Medicine | Admitting: Internal Medicine

## 2015-08-03 DIAGNOSIS — R001 Bradycardia, unspecified: Secondary | ICD-10-CM | POA: Diagnosis present

## 2015-08-03 DIAGNOSIS — I482 Chronic atrial fibrillation, unspecified: Secondary | ICD-10-CM | POA: Diagnosis present

## 2015-08-03 DIAGNOSIS — I2510 Atherosclerotic heart disease of native coronary artery without angina pectoris: Secondary | ICD-10-CM | POA: Diagnosis present

## 2015-08-03 DIAGNOSIS — R531 Weakness: Secondary | ICD-10-CM

## 2015-08-03 DIAGNOSIS — G8194 Hemiplegia, unspecified affecting left nondominant side: Secondary | ICD-10-CM | POA: Diagnosis present

## 2015-08-03 DIAGNOSIS — E785 Hyperlipidemia, unspecified: Secondary | ICD-10-CM | POA: Diagnosis present

## 2015-08-03 DIAGNOSIS — M179 Osteoarthritis of knee, unspecified: Secondary | ICD-10-CM | POA: Diagnosis present

## 2015-08-03 DIAGNOSIS — Z79899 Other long term (current) drug therapy: Secondary | ICD-10-CM

## 2015-08-03 DIAGNOSIS — R29898 Other symptoms and signs involving the musculoskeletal system: Secondary | ICD-10-CM

## 2015-08-03 DIAGNOSIS — Z882 Allergy status to sulfonamides status: Secondary | ICD-10-CM | POA: Diagnosis not present

## 2015-08-03 DIAGNOSIS — B9620 Unspecified Escherichia coli [E. coli] as the cause of diseases classified elsewhere: Secondary | ICD-10-CM | POA: Diagnosis present

## 2015-08-03 DIAGNOSIS — N3281 Overactive bladder: Secondary | ICD-10-CM | POA: Diagnosis present

## 2015-08-03 DIAGNOSIS — Z87891 Personal history of nicotine dependence: Secondary | ICD-10-CM

## 2015-08-03 DIAGNOSIS — Z7982 Long term (current) use of aspirin: Secondary | ICD-10-CM

## 2015-08-03 DIAGNOSIS — Z515 Encounter for palliative care: Secondary | ICD-10-CM | POA: Diagnosis present

## 2015-08-03 DIAGNOSIS — I6340 Cerebral infarction due to embolism of unspecified cerebral artery: Principal | ICD-10-CM | POA: Diagnosis present

## 2015-08-03 DIAGNOSIS — Z8546 Personal history of malignant neoplasm of prostate: Secondary | ICD-10-CM | POA: Diagnosis not present

## 2015-08-03 DIAGNOSIS — E1142 Type 2 diabetes mellitus with diabetic polyneuropathy: Secondary | ICD-10-CM | POA: Diagnosis present

## 2015-08-03 DIAGNOSIS — N390 Urinary tract infection, site not specified: Secondary | ICD-10-CM | POA: Diagnosis present

## 2015-08-03 DIAGNOSIS — Z7901 Long term (current) use of anticoagulants: Secondary | ICD-10-CM | POA: Diagnosis not present

## 2015-08-03 DIAGNOSIS — R1310 Dysphagia, unspecified: Secondary | ICD-10-CM | POA: Diagnosis present

## 2015-08-03 DIAGNOSIS — Z66 Do not resuscitate: Secondary | ICD-10-CM | POA: Diagnosis present

## 2015-08-03 DIAGNOSIS — I10 Essential (primary) hypertension: Secondary | ICD-10-CM | POA: Diagnosis present

## 2015-08-03 DIAGNOSIS — K219 Gastro-esophageal reflux disease without esophagitis: Secondary | ICD-10-CM | POA: Diagnosis present

## 2015-08-03 DIAGNOSIS — I639 Cerebral infarction, unspecified: Secondary | ICD-10-CM | POA: Diagnosis present

## 2015-08-03 DIAGNOSIS — Z86711 Personal history of pulmonary embolism: Secondary | ICD-10-CM

## 2015-08-03 LAB — URINALYSIS COMPLETE WITH MICROSCOPIC (ARMC ONLY)
Bilirubin Urine: NEGATIVE
Glucose, UA: NEGATIVE mg/dL
Hgb urine dipstick: NEGATIVE
KETONES UR: NEGATIVE mg/dL
NITRITE: POSITIVE — AB
PH: 5 (ref 5.0–8.0)
PROTEIN: NEGATIVE mg/dL
SPECIFIC GRAVITY, URINE: 1.014 (ref 1.005–1.030)

## 2015-08-03 LAB — CBC WITH DIFFERENTIAL/PLATELET
Basophils Absolute: 0 10*3/uL (ref 0–0.1)
Basophils Relative: 0 %
Eosinophils Absolute: 0.1 10*3/uL (ref 0–0.7)
Eosinophils Relative: 1 %
HEMATOCRIT: 39 % — AB (ref 40.0–52.0)
Hemoglobin: 13.2 g/dL (ref 13.0–18.0)
LYMPHS ABS: 2.1 10*3/uL (ref 1.0–3.6)
LYMPHS PCT: 20 %
MCH: 30.6 pg (ref 26.0–34.0)
MCHC: 33.8 g/dL (ref 32.0–36.0)
MCV: 90.5 fL (ref 80.0–100.0)
MONO ABS: 0.9 10*3/uL (ref 0.2–1.0)
MONOS PCT: 9 %
NEUTROS ABS: 7.3 10*3/uL — AB (ref 1.4–6.5)
Neutrophils Relative %: 70 %
Platelets: 207 10*3/uL (ref 150–440)
RBC: 4.31 MIL/uL — ABNORMAL LOW (ref 4.40–5.90)
RDW: 15.2 % — AB (ref 11.5–14.5)
WBC: 10.5 10*3/uL (ref 3.8–10.6)

## 2015-08-03 LAB — COMPREHENSIVE METABOLIC PANEL
ALT: 11 U/L — ABNORMAL LOW (ref 17–63)
ANION GAP: 11 (ref 5–15)
AST: 20 U/L (ref 15–41)
Albumin: 4 g/dL (ref 3.5–5.0)
Alkaline Phosphatase: 53 U/L (ref 38–126)
BILIRUBIN TOTAL: 0.8 mg/dL (ref 0.3–1.2)
BUN: 26 mg/dL — AB (ref 6–20)
CALCIUM: 9.2 mg/dL (ref 8.9–10.3)
CO2: 23 mmol/L (ref 22–32)
Chloride: 104 mmol/L (ref 101–111)
Creatinine, Ser: 1.15 mg/dL (ref 0.61–1.24)
GFR calc Af Amer: 60 mL/min — ABNORMAL LOW (ref >60)
GFR calc non Af Amer: 52 mL/min — ABNORMAL LOW (ref >60)
GLUCOSE: 140 mg/dL — AB (ref 65–99)
Potassium: 4 mmol/L (ref 3.5–5.1)
Sodium: 138 mmol/L (ref 135–145)
TOTAL PROTEIN: 6.6 g/dL (ref 6.5–8.1)

## 2015-08-03 LAB — TROPONIN I: Troponin I: 0.03 ng/mL (ref <0.03)

## 2015-08-03 LAB — LACTIC ACID, PLASMA: Lactic Acid, Venous: 1.6 mmol/L (ref 0.5–1.9)

## 2015-08-03 LAB — PROTIME-INR
INR: 2.32
Prothrombin Time: 25.2 seconds — ABNORMAL HIGH (ref 11.4–15.0)

## 2015-08-03 LAB — DIGOXIN LEVEL: DIGOXIN LVL: 1.5 ng/mL (ref 0.8–2.0)

## 2015-08-03 MED ORDER — ONDANSETRON HCL 4 MG PO TABS: 4.0000 mg | ORAL_TABLET | Freq: Four times a day (QID) | ORAL | Status: DC | PRN

## 2015-08-03 MED ORDER — ACETAMINOPHEN 650 MG RE SUPP
650.0000 mg | Freq: Four times a day (QID) | RECTAL | Status: DC | PRN
  Administered 2015-08-04: 650 mg via RECTAL
  Filled 2015-08-03: qty 1

## 2015-08-03 MED ORDER — DEXTROSE 5 % IV SOLN
1.0000 g | Freq: Once | INTRAVENOUS | Status: AC
  Administered 2015-08-03: 1 g via INTRAVENOUS
  Filled 2015-08-03: qty 10

## 2015-08-03 MED ORDER — ASPIRIN 81 MG PO CHEW
81.0000 mg | CHEWABLE_TABLET | Freq: Every day | ORAL | Status: DC
Start: 2015-08-04 — End: 2015-08-04
  Administered 2015-08-04: 81 mg via ORAL
  Filled 2015-08-03: qty 1

## 2015-08-03 MED ORDER — SODIUM CHLORIDE 0.9% FLUSH
3.0000 mL | Freq: Two times a day (BID) | INTRAVENOUS | Status: DC
  Administered 2015-08-04 – 2015-08-08 (×8): 3 mL via INTRAVENOUS

## 2015-08-03 MED ORDER — SODIUM CHLORIDE 0.9 % IV BOLUS (SEPSIS)
500.0000 mL | Freq: Once | INTRAVENOUS | Status: AC
  Administered 2015-08-03: 500 mL via INTRAVENOUS

## 2015-08-03 MED ORDER — HYDROCODONE-ACETAMINOPHEN 5-325 MG PO TABS
1.0000 | ORAL_TABLET | ORAL | Status: DC | PRN
  Administered 2015-08-04: 1 via ORAL
  Filled 2015-08-03: qty 1

## 2015-08-03 MED ORDER — METOPROLOL TARTRATE 25 MG PO TABS
25.0000 mg | ORAL_TABLET | Freq: Two times a day (BID) | ORAL | Status: DC
  Administered 2015-08-04: 25 mg via ORAL
  Filled 2015-08-03: qty 1

## 2015-08-03 MED ORDER — ACETAMINOPHEN 325 MG PO TABS
650.0000 mg | ORAL_TABLET | Freq: Four times a day (QID) | ORAL | Status: DC | PRN
  Administered 2015-08-05 – 2015-08-07 (×4): 650 mg via ORAL
  Filled 2015-08-03 (×4): qty 2

## 2015-08-03 MED ORDER — ONDANSETRON HCL 4 MG/2ML IJ SOLN: 4.0000 mg | Freq: Four times a day (QID) | INTRAMUSCULAR | Status: DC | PRN

## 2015-08-03 MED ORDER — PANTOPRAZOLE SODIUM 40 MG PO TBEC
40.0000 mg | DELAYED_RELEASE_TABLET | Freq: Every day | ORAL | Status: DC
  Administered 2015-08-04: 40 mg via ORAL
  Filled 2015-08-03: qty 1

## 2015-08-03 MED ORDER — DIGOXIN 125 MCG PO TABS
0.2500 mg | ORAL_TABLET | Freq: Every day | ORAL | Status: DC
  Administered 2015-08-04: 0.25 mg via ORAL
  Filled 2015-08-03: qty 2

## 2015-08-03 NOTE — ED Notes (Signed)
Patient transported to X-ray 

## 2015-08-03 NOTE — ED Provider Notes (Signed)
Time Seen: Approximately 2100  I have reviewed the triage notes  Chief Complaint: Weakness   History of Present Illness: Samuel Wagner is a 82 y.o. male who arrives by EMS from home. The patient's apparently had some generalized weakness with difficulty with ambulation over the last 2-3 days. No obvious fever at home that checks in here 99.3 orally. Patient himself has no physical complaints and seems to answer most questions appropriately. No obvious focal weakness. He denies any chest pain, shortness of breath, productive cough or wheezing. Patient has history of prostate cancer and is incontinence of urine. Past Medical History  Diagnosis Date  . A-fib ([place])   . Acute renal failure ([place])   . DVT (deep venous thrombosis) ([place])   . Hypokalemia   . Hypertension   . Prostate cancer ([place])   . Stroke ([place])   . CAD (coronary artery disease)   . Hyperlipidemia   . Arthritis   . Pulmonary embolism ([place])   . Fall at home   . Rectal incontinence   . OAB (overactive bladder)   . Arrhythmia   . Nocturia   . Urge incontinence   . Neoplasm of uncertain behavior of kidney   . Acute cystitis   . Incomplete bladder emptying   . Rosacea   . Peripheral neuropathy ([place])   . DJD (degenerative joint disease)   . Vertigo   . Shingles   . CVD (cardiovascular disease)     Patient Active Problem List   Diagnosis Date Noted  . Arteriosclerosis of coronary artery 10/01/2014  . HLD (hyperlipidemia) 10/01/2014  . BP (high blood pressure) 10/01/2014  . Closed arm fracture 11/06/2013  . Glenoid fracture of shoulder 10/15/2013  . Chronic a-fib ([place]) Feb 27, 202015  . DVT (deep venous thrombosis) ([place]) Feb 27, 202015  . Late effects of CVA (cerebrovascular accident) Feb 27, 202015  . Chronic anticoagulation Feb 27, 202015  . Essential hypertension, benign Feb 27, 202015  . E-coli UTI Feb 27, 202015  . GERD (gastroesophageal reflux disease) Feb 27, 202015  . Physical deconditioning Feb 27, 202015  . Arthritis of knee,  degenerative 07/31/2013  . A-fib ([place]) 05/20/2013  . Acute cystitis 12/12/2011  . Incomplete bladder emptying 12/12/2011  . CA of prostate ([place]) 12/12/2011  . Flu vaccine need 12/12/2011  . 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 Excessive urination at night 12/12/2011  . Infection of urinary tract 12/12/2011  . Urge incontinence 12/12/2011    Past Surgical History  Procedure Laterality Date  . Appendectomy    . Cholecystectomy    . Hernia repair    . Right eye surgery    . Nasal sinus surgery    . Cataract extraction Bilateral 1995    Past Surgical History  Procedure Laterality Date  . Appendectomy    . Cholecystectomy    . Hernia repair    . Right eye surgery    . Nasal sinus surgery    . Cataract extraction Bilateral 1995    Current Outpatient Rx  Name  Route  Sig  Dispense  Refill  . aspirin 81 MG tablet   Oral   Take 81 mg by mouth.         . digoxin (LANOXIN) 0.25 MG tablet   Oral   Take 0.25 mg by mouth daily.         Marland Kitchen docusate sodium (COLACE) 100 MG capsule   Oral   Take 100 mg by mouth 2 (two) times daily.         Marland Kitchen HYDROcodone-acetaminophen (NORCO/VICODIN)  5-325 MG per tablet      Take one tablet by mouth every 6 hours as needed for severe pain Patient not taking: Reported on 10/01/2014   120 tablet   0   . metoprolol tartrate (LOPRESSOR) 25 MG tablet      TAKE 1 TABLET (25 MG TOTAL) BY MOUTH 2 (TWO) TIMES DAILY.      5   . Multiple Vitamins-Minerals (ICAPS AREDS 2) CAPS   Oral   Take by mouth.         . Omega 3 1000 MG CAPS   Oral   Take by mouth.         . pantoprazole (PROTONIX) 40 MG tablet   Oral   Take 40 mg by mouth daily.         . traMADol (ULTRAM) 50 MG tablet   Oral   Take 50 mg by mouth every 4 (four) hours as needed.         . warfarin (COUMADIN) 1 MG tablet   Oral   Take 4 mg by mouth daily at 6 PM. 4.5 mg daily         . warfarin (COUMADIN) 4 MG tablet   Oral   Take 4 mg by  mouth daily.      11   . warfarin (COUMADIN) 5 MG tablet      TAKE 1 TABLET (5 MG TOTAL) BY MOUTH ONCE DAILY.      5     Allergies:  Sulfa antibiotics and Sulfacetamide sodium  Family History: Family History  Problem Relation Age of Onset  . Stroke Mother   . Stroke Father   . Cancer Father     Prostate  . Cancer Son     prostate    Social History: Social History  Substance Use Topics  . Smoking status: Former Research scientist (life sciences)  . Smokeless tobacco: None  . Alcohol Use: No     Review of Systems:   10 point review of systems was performed and was otherwise negative:  Constitutional: No fever Eyes: No visual disturbances ENT: No sore throat, ear pain Cardiac: No chest pain Respiratory: No shortness of breath, wheezing, or stridor Abdomen: No abdominal pain, no vomiting, No diarrhea Endocrine: No weight loss, No night sweats Extremities: No peripheral edema, cyanosis Skin: No rashes, easy bruising Neurologic: No focal weakness, trouble with speech or swollowing Urologic: No dysuria, Hematuria, or urinary frequency   Physical Exam:  ED Triage Vitals  Enc Vitals Group     BP 08/03/15 2048 127/79 mmHg     Pulse Rate 08/03/15 2046 88     Resp 08/03/15 2046 18     Temp 08/03/15 2049 99.3 F (37.4 C)     Temp Source 08/03/15 2049 Oral     SpO2 08/03/15 2046 97 %     Weight --      Height --      Head Cir --      Peak Flow --      Pain Score --      Pain Loc --      Pain Edu? --      Excl. in [place]? --     General: Awake , Alert , and Oriented times 3; GCS 15 Head: Normal cephalic , atraumatic. Flushed appearance of rosacea Eyes: Pupils equal , round, reactive to light Nose/Throat: No nasal drainage, patent upper airway without erythema or exudate.  Neck: Supple, Full range of motion, No anterior adenopathy or  palpable thyroid masses Lungs: Clear to ascultation without wheezes , rhonchi, or rales Heart: Regular rate, regular rhythm without murmurs , gallops , or  rubs Abdomen: Soft, non tender without rebound, guarding , or rigidity; bowel sounds positive and symmetric in all 4 quadrants. No organomegaly .        Extremities: 2 plus symmetric pulses. No edema, clubbing or cyanosis Neurologic: , Motor symmetric without deficits, sensory intact Skin: warm, dry, no rashes   Labs:   All laboratory work was reviewed including any pertinent negatives or positives listed below:  Labs Reviewed  CULTURE, BLOOD (ROUTINE X 2)  CULTURE, BLOOD (ROUTINE X 2)  URINE CULTURE  CBC WITH DIFFERENTIAL/PLATELET  COMPREHENSIVE METABOLIC PANEL  TROPONIN I  LACTIC ACID, PLASMA  LACTIC ACID, PLASMA  URINALYSIS COMPLETEWITH MICROSCOPIC ([facility] ONLY)  Review of the patient's laboratory work shows findings for significant urinary tract infection.  EKG:  ED ECG REPORT I, Daymon Larsen, the attending physician, personally viewed and interpreted this ECG.  Date: 08/03/2015 EKG Time: 2047 Rate: 90 Rhythm: normal sinus rhythm QRS Axis: normal Intervals: Right bundle-branch block pattern ST/T Wave abnormalities: normal Conduction Disturbances: none Narrative Interpretation: unremarkable Right bundle-branch block more evident than previous EKG performed on 10-09-23. Diffuse ST depression is consistent. No obvious acute ischemic changes   Radiology:  DG Chest 2 View (Final result) Result time: 08/03/15 22:13:25   Final result by Rad Results In Interface (08/03/15 22:13:25)   Narrative:   CLINICAL DATA: Generalize weakness for couple days. Difficulty walking. Fever.  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 No change in appearance since the previous chest radiograph. Heart size is normal. Calcification of the aorta. Linear atelectasis in the lung bases. No focal consolidation. No blunting of costophrenic angles. No  pneumothorax. Degenerative changes in the spine and shoulders.  IMPRESSION: Stable appearance of right cardiophrenic angle lesion corresponding to a cyst on prior CT. No evidence of active pulmonary disease. Aortic atherosclerosis.   Electronically Signed By: Lucienne Capers M.D.       I personally reviewed the radiologic studies    ED Course:  Patient's stay here was uneventful and he was given some IV fluids along with initiation of IV antibiotics for what appears to be a urinary tract infection which is likely causing his low-grade fever and generalized weakness. Not appear to be septic at this point with a normal white blood cell count, normal hemodynamics, and a negative lactic acid level.    Assessment:  Acute urinary tract infection Generalize weakness History of prostate cancer     Plan: Inpatient management Patient's case was reviewed with the hospitalist team, further disposition and management depends upon their evaluation.`            Daymon Larsen, MD 08/03/15 2242

## 2015-08-03 NOTE — H&P (Signed)
[facility] [place] NAME: Samuel Wagner    MR#:  [id number]  DATE OF BIRTH:  1916/11/27  DATE OF ADMISSION:  08/03/2015  PRIMARY CARE PHYSICIAN: Leonel Ramsay, MD   REQUESTING/REFERRING PHYSICIAN: Marcelene Butte, MD  CHIEF COMPLAINT:   Chief Complaint  Patient presents with  . Weakness    HISTORY OF PRESENT ILLNESS:  Samuel Wagner  is a 82 y.o. male who presents with Now is weakness for the past 1-2 days. Patient was being cared for by his son who states that it was the point that he was unable to even get around his house without having to be assisted completely. Son brought him in the ED concern for possible stroke. Here he was found to have nitrite positive UTI. Patient is not currently septic at this time. Hospitalists were called for admission.  PAST MEDICAL HISTORY:   Past Medical History  Diagnosis Date  . A-fib ([place])   . Acute renal failure ([place])   . DVT (deep venous thrombosis) ([place])   . Hypokalemia   . Hypertension   . Prostate cancer ([place])   . Stroke ([place])   . CAD (coronary artery disease)   . Hyperlipidemia   . Arthritis   . Pulmonary embolism ([place])   . Fall at home   . Rectal incontinence   . OAB (overactive bladder)   . Arrhythmia   . Nocturia   . Urge incontinence   . Neoplasm of uncertain behavior of kidney   . Acute cystitis   . Incomplete bladder emptying   . Rosacea   . Peripheral neuropathy ([place])   . DJD (degenerative joint disease)   . Vertigo   . Shingles   . CVD (cardiovascular disease)     PAST SURGICAL HISTORY:   Past Surgical History  Procedure Laterality Date  . Appendectomy    . Cholecystectomy    . Hernia repair    . Right eye surgery    . Nasal sinus surgery    . Cataract extraction Bilateral 1995    SOCIAL HISTORY:   Social History  Substance Use Topics  . Smoking status: Former Research scientist (life sciences)  . Smokeless tobacco: Not on file  . Alcohol Use: No    FAMILY HISTORY:    Family History  Problem Relation Age of Onset  . Stroke Mother   . Stroke Father   . Cancer Father     Prostate  . Cancer Son     prostate    DRUG ALLERGIES:   Allergies  Allergen Reactions  . Sulfa Antibiotics Rash    Other reaction(s): RASH  . Sulfacetamide Sodium Rash    MEDICATIONS AT HOME:   Prior to Admission medications   Medication Sig Start Date End Date Taking? Authorizing Provider  aspirin 81 MG tablet Take 81 mg by mouth. 12/12/11  Yes Historical Provider, MD  digoxin (LANOXIN) 0.25 MG tablet Take 0.25 mg by mouth daily.   Yes Historical Provider, MD  metoprolol tartrate (LOPRESSOR) 25 MG tablet TAKE 1 TABLET (25 MG TOTAL) BY MOUTH 2 (TWO) TIMES DAILY. 09/16/14  Yes Historical Provider, MD  Multiple Vitamins-Minerals (ICAPS AREDS 2) CAPS Take 2 capsules by mouth 2 (two) times daily.    Yes Historical Provider, MD  pantoprazole (PROTONIX) 40 MG tablet Take 40 mg by mouth daily.   Yes Historical Provider, MD  traMADol (ULTRAM) 50 MG tablet Take 50 mg by mouth every 4 (four) hours as needed.   Yes  Historical Provider, MD  warfarin (COUMADIN) 4 MG tablet Take 4 mg by mouth daily. 09/16/14  Yes Historical Provider, MD  warfarin (COUMADIN) 5 MG tablet TAKE 1 TABLET (5 MG TOTAL) BY MOUTH ONCE DAILY. 09/01/14  Yes Historical Provider, MD    REVIEW OF SYSTEMS:  Review of Systems  Constitutional: Negative for fever, chills, weight loss and malaise/fatigue.  HENT: Negative for ear pain, hearing loss and tinnitus.   Eyes: Negative for blurred vision, double vision, pain and redness.  Respiratory: Negative for cough, hemoptysis and shortness of breath.   Cardiovascular: Negative for chest pain, palpitations, orthopnea and leg swelling.  Gastrointestinal: Positive for abdominal pain. Negative for nausea, vomiting, diarrhea and constipation.  Genitourinary: Negative for dysuria, frequency and hematuria.  Musculoskeletal: Negative for back pain, joint pain and neck pain.   Skin:       No acne, rash, or lesions  Neurological: Positive for weakness. Negative for dizziness, tremors and focal weakness.       Confusion  Endo/Heme/Allergies: Negative for polydipsia. Does not bruise/bleed easily.  Psychiatric/Behavioral: Negative for depression. The patient is not nervous/anxious and does not have insomnia.      VITAL SIGNS:   Filed Vitals:   08/03/15 2046 08/03/15 2048 08/03/15 2049 08/03/15 2113  BP:  127/79 127/79   Pulse: 88 89    Temp:   99.3 F (37.4 C) 99.4 F (37.4 C)  TempSrc:   Oral Rectal  Resp: 18 16    SpO2: 97% 94%     Wt Readings from Last 3 Encounters:  10/01/14 87.726 kg (193 lb 6.4 oz)  10/21/13 92.08 kg (203 lb)  10/13/13 92.08 kg (203 lb)    PHYSICAL EXAMINATION:  Physical Exam  Vitals reviewed. Constitutional: He is oriented to person, place, and time. He appears well-developed and well-nourished. No distress.  HENT:  Head: Normocephalic and atraumatic.  Mouth/Throat: Oropharynx is clear and moist.  Eyes: Conjunctivae and EOM are normal. Pupils are equal, round, and reactive to light. No scleral icterus.  Neck: Normal range of motion. Neck supple. No JVD present. No thyromegaly present.  Cardiovascular: Normal rate, regular rhythm and intact distal pulses.  Exam reveals no gallop and no friction rub.   No murmur heard. Respiratory: Effort normal and breath sounds normal. No respiratory distress. He has no wheezes. He has no rales.  GI: Soft. Bowel sounds are normal. He exhibits no distension. There is tenderness (Suprapubic).  Musculoskeletal: Normal range of motion. He exhibits no edema.  No arthritis, no gout  Lymphadenopathy:    He has no cervical adenopathy.  Neurological: He is alert and oriented to person, place, and time. No cranial nerve deficit.  No dysarthria, no aphasia  Skin: Skin is warm and dry. No rash noted. No erythema.  Psychiatric: He has a normal mood and affect. His behavior is normal. Judgment and  thought content normal.    LABORATORY PANEL:   CBC  Recent Labs Lab 08/03/15 2047  WBC 10.5  HGB 13.2  HCT 39.0*  PLT 207   ------------------------------------------------------------------------------------------------------------------  Chemistries   Recent Labs Lab 08/03/15 2047  NA 138  K 4.0  CL 104  CO2 23  GLUCOSE 140*  BUN 26*  CREATININE 1.15  CALCIUM 9.2  AST 20  ALT 11*  ALKPHOS 53  BILITOT 0.8   ------------------------------------------------------------------------------------------------------------------  Cardiac Enzymes  Recent Labs Lab 08/03/15 2047  TROPONINI 0.03*   ------------------------------------------------------------------------------------------------------------------  RADIOLOGY:  Dg Chest 2 View  08/03/2015  CLINICAL DATA:  Generalize  weakness for couple days. Difficulty walking. Fever. 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 No change in appearance since the previous chest radiograph. Heart size is normal. Calcification of the aorta. Linear atelectasis in the lung bases. No focal consolidation. No blunting of costophrenic angles. No pneumothorax. Degenerative changes in the spine and shoulders. IMPRESSION: Stable appearance of right cardiophrenic angle lesion corresponding to a cyst on prior CT. No evidence of active pulmonary disease. Aortic atherosclerosis. Electronically Signed   By: Lucienne Capers M.D.   On: 08/03/2015 22:13    EKG:   Orders placed or performed during the hospital encounter of 08/03/15  . EKG 12-Lead  . EKG 12-Lead    IMPRESSION AND PLAN:  Principal Problem:   UTI (lower urinary tract infection) - IV antibiotics, follow urine culture and tailor antibiotics as needed. Active Problems:   Generalized weakness - likely has a secondary effect of the UTI, get PT consult in the  morning.   Chronic a-fib (HCC) - and tingling anticoagulation and home meds for rate control   Essential hypertension, benign - currently stable, continue home meds   GERD (gastroesophageal reflux disease) - home dose PPI  All the records are reviewed and case discussed with ED provider. Management plans discussed with the patient and/or family.  DVT PROPHYLAXIS: Systemic anticoagulation  GI PROPHYLAXIS: PPI  ADMISSION STATUS: Inpatient  CODE STATUS: FUll Code Status History    This patient does not have a recorded code status. Please follow your organizational policy for patients in this situation.      TOTAL TIME TAKING CARE OF THIS PATIENT: 45 minutes.    Ammon Muscatello [place] 08/03/2015, 10:59 PM  [facility]  Office  [phone]  CC: Primary care physician; Leonel Ramsay, MD

## 2015-08-03 NOTE — ED Notes (Signed)
Pt from home via EMS, reports generalized weakness for past couple days, family reports diff walking

## 2015-08-04 ENCOUNTER — Inpatient Hospital Stay: Payer: Medicare Other

## 2015-08-04 LAB — BASIC METABOLIC PANEL
ANION GAP: 8 (ref 5–15)
BUN: 19 mg/dL (ref 6–20)
CALCIUM: 8.5 mg/dL — AB (ref 8.9–10.3)
CO2: 24 mmol/L (ref 22–32)
Chloride: 105 mmol/L (ref 101–111)
Creatinine, Ser: 0.9 mg/dL (ref 0.61–1.24)
GFR calc Af Amer: >60 mL/min (ref >60)
GFR calc non Af Amer: >60 mL/min (ref >60)
GLUCOSE: 153 mg/dL — AB (ref 65–99)
Potassium: 3.7 mmol/L (ref 3.5–5.1)
Sodium: 137 mmol/L (ref 135–145)

## 2015-08-04 LAB — CBC
HEMATOCRIT: 36.3 % — AB (ref 40.0–52.0)
Hemoglobin: 12.4 g/dL — ABNORMAL LOW (ref 13.0–18.0)
MCH: 31 pg (ref 26.0–34.0)
MCHC: 34.2 g/dL (ref 32.0–36.0)
MCV: 90.9 fL (ref 80.0–100.0)
Platelets: 184 10*3/uL (ref 150–440)
RBC: 3.99 MIL/uL — ABNORMAL LOW (ref 4.40–5.90)
RDW: 15.4 % — AB (ref 11.5–14.5)
WBC: 11.2 10*3/uL — AB (ref 3.8–10.6)

## 2015-08-04 MED ORDER — WARFARIN SODIUM 4 MG PO TABS: 4.0000 mg | ORAL_TABLET | ORAL | Status: DC

## 2015-08-04 MED ORDER — ASPIRIN 81 MG PO CHEW: 324.0000 mg | CHEWABLE_TABLET | Freq: Every day | ORAL | Status: DC

## 2015-08-04 MED ORDER — ATORVASTATIN CALCIUM 20 MG PO TABS
40.0000 mg | ORAL_TABLET | Freq: Every day | ORAL | Status: DC
  Administered 2015-08-04: 40 mg via ORAL
  Filled 2015-08-04: qty 2

## 2015-08-04 MED ORDER — WARFARIN SODIUM 5 MG PO TABS
5.0000 mg | ORAL_TABLET | ORAL | Status: DC
  Administered 2015-08-04: 18:00:00 5 mg via ORAL
  Filled 2015-08-04: qty 1

## 2015-08-04 MED ORDER — METOPROLOL TARTRATE 25 MG PO TABS
12.5000 mg | ORAL_TABLET | Freq: Two times a day (BID) | ORAL | Status: DC
  Administered 2015-08-04: 22:00:00 12.5 mg via ORAL
  Filled 2015-08-04: qty 1

## 2015-08-04 MED ORDER — DEXTROSE 5 % IV SOLN
2.0000 g | INTRAVENOUS | Status: DC
  Administered 2015-08-04 – 2015-08-05 (×2): 2 g via INTRAVENOUS
  Filled 2015-08-04 (×3): qty 2

## 2015-08-04 MED ORDER — ASPIRIN EC 325 MG PO TBEC: 325.0000 mg | DELAYED_RELEASE_TABLET | Freq: Every day | ORAL | Status: DC

## 2015-08-04 MED ORDER — SODIUM CHLORIDE 0.9 % IV SOLN
INTRAVENOUS | Status: DC
  Administered 2015-08-04: 14:00:00 via INTRAVENOUS

## 2015-08-04 NOTE — Clinical Social Work Note (Signed)
Clinical Social Work Assessment  Patient Details  Name: Samuel Wagner MRN: [id number] Date of Birth: 1916-05-09  Date of referral:  08/04/15               Reason for consult:  Facility Placement                Permission sought to share information with:  Chartered certified accountant granted to share information::  Yes, Verbal Permission Granted  Name::      [place]::   [place]   Relationship::     Contact Information:     Housing/Transportation Living arrangements for the past 2 months:  [place] of Information:  Power of [place], Adult Children Patient Interpreter Needed:  None Criminal Activity/Legal Involvement Pertinent to Current Situation/Hospitalization:  No - Comment as needed Significant Relationships:  Adult Children Lives with:  Self Do you feel safe going back to the place where you live?  Yes Need for family participation in patient care:  Yes (Comment)  Care giving concerns:  Patient lives alone in [place].    Social Worker assessment / plan:  Holiday representative (CSW) reviewed chart and noted that patient was brought in for weakness and not able to walk. Per chart patient is confused, there is no family at bedside. CSW contacted patient's son Zenia Resides to complete assessment. Per Zenia Resides he is patient's HPOA and he lives 15 minutes away from patient. Per Zenia Resides patient's other son Richardson Landry lives in [place] and is in bad health and his support is limited. Per Zenia Resides patient is independent and drives at baseline. Zenia Resides reported that patient has a bad UTI and has become weaker over the past few days. CSW made son aware that PT will evaluate patient and make a recommendation of home health or SNF. Son prefers SNF and asked for [facility]. Per son patient was at [facility] 2 years ago after a similar hospitalzation for a UTI. CSW explained to son that patient will have to meet a 3 night qualifying inpatient  stay at [facility] in order for Medicare to pay for SNF. Patient was admitted to inpatient on 08/03/15. Son verbalized his understanding.   FL2 complete and faxed out. CSW will continue to follow and assist as needed.   Employment status:  Retired Forensic scientist:  Medicare PT Recommendations:  Not assessed at this time Information / Referral to community resources:  [place]  Patient/Family's Response to care:  Patient's son prefers SNF but understands if PT recommends home health patient will return home.   Patient/Family's Understanding of and Emotional Response to Diagnosis, Current Treatment, and Prognosis:  Patient's son Zenia Resides was pleasant and thanked CSW for calling.   Emotional Assessment Appearance:  Appears stated age Attitude/Demeanor/Rapport:  Unable to Assess Affect (typically observed):  Unable to Assess Orientation:  Oriented to Self, Fluctuating Orientation (Suspected and/or reported Sundowners) Alcohol / Substance use:  Not Applicable Psych involvement (Current and /or in the community):  No (Comment)  Discharge Needs  Concerns to be addressed:  Discharge Planning Concerns Readmission within the last 30 days:  No Current discharge risk:  Dependent with Mobility Barriers to Discharge:  Continued Medical Work up   [employer], Veronia Beets, LCSW 08/04/2015, 12:12 PM

## 2015-08-04 NOTE — Progress Notes (Signed)
Per Dr. Ether Griffins, change asa to chewable so they can be crushed per SLP recommendation. Dose to start tomorrow.

## 2015-08-04 NOTE — Evaluation (Signed)
Clinical/Bedside Swallow Evaluation Patient Details  Name: Samuel Wagner MRN: [id number] Date of Birth: [birth year]  Today's Date: 08/04/2015 Time: SLP Start Time (ACUTE ONLY): [id number] SLP Stop Time (ACUTE ONLY): 1515 SLP Time Calculation (min) (ACUTE ONLY): 60 min  Past Medical History:  Past Medical History  Diagnosis Date  . A-fib ([place])   . Acute renal failure ([place])   . DVT (deep venous thrombosis) ([place])   . Hypokalemia   . Hypertension   . Prostate cancer ([place])   . Stroke ([place])   . CAD (coronary artery disease)   . Hyperlipidemia   . Arthritis   . Pulmonary embolism ([place])   . Fall at home   . Rectal incontinence   . OAB (overactive bladder)   . Arrhythmia   . Nocturia   . Urge incontinence   . Neoplasm of uncertain behavior of kidney   . Acute cystitis   . Incomplete bladder emptying   . Rosacea   . Peripheral neuropathy ([place])   . DJD (degenerative joint disease)   . Vertigo   . Shingles   . CVD (cardiovascular disease)    Past Surgical History:  Past Surgical History  Procedure Laterality Date  . Appendectomy    . Cholecystectomy    . Hernia repair    . Right eye surgery    . Nasal sinus surgery    . Cataract extraction Bilateral 1995   HPI:  Patient is a 82 year old Caucasian male status post history significant for history of A. fib, DVT, hypokalemia, hypertension, stroke, coronary artery disease, hyperlipidemia, PE, who presents to the hospital with complaints of generalized weakness, worsening over the past 1-2 days. On arrival to the hospital patient was noted to have pyuria, admitted for urinary tract infection and associated weakness. Patient denies any pain today, unable to review systems due to dementia/medical condition. Pt currently on regular diet with thin liquids. No report of swallowing deficits. Skilled ST ordered to evaluate current diet and communication abilities.    Assessment / Plan / Recommendation Clinical Impression  Pt presents with  mild to moderate oropharyngeal dysphagia c/b marked right facial droop, decreased lingual manipulation of regular texture boluses, munch chew and inability to follow general aspiration precaution/compensatory strategies. Nursing and son advises that pt has experienced significant decline throughout this day. Pt with declined swallow ability from breakfast tray this day. Pt with difficulty maintaining right labial seal on cup rim. ST administered thin liquids via cup sips. Pt presented with timely swallow initiation and no overt s/s of aspiration. Pt minimally verbal this session and required moderate to max verbal and tactile cues to maintain alertness. Pt at risk for choking on regular diet and recommend pt downgrade to dysphagia 2 with thin liquids, medicine crushed in puree. Pt's ability to communicate wants and needs also appear significantly impacted as well as cognitive function. However pt unable to maintain alertness for full cognitive-linguistic evaluation at this time. Skilled ST indicated for dysphagia therapy. Aspiration precautions and diet downgrade explained to son and he verbalized understanding and agreement.     Aspiration Risk  Moderate aspiration risk    Diet Recommendation Dysphagia 2 with thin liquids  Medication Administration: Crushed with puree    Other  Recommendations Oral Care Recommendations: Oral care BID   Follow up Recommendations  24 hour supervision/assistance;Skilled Nursing facility    Frequency and Duration min 2x/week  2 weeks       Prognosis Prognosis for Safe Diet Advancement: Guarded Barriers to Reach Goals:  Cognitive deficits;Language deficits      Swallow Study   General Date of Onset: 08/03/15 HPI: Patient is a 82 year old Caucasian male status post history significant for history of A. fib, DVT, hypokalemia, hypertension, stroke, coronary artery disease, hyperlipidemia, PE, who presents to the hospital with complaints of generalized weakness,  worsening over the past 1-2 days. On arrival to the hospital patient was noted to have pyuria, admitted for urinary tract infection and associated weakness. Patient denies any pain today, unable to review systems due to dementia/medical condition. Pt currently on regular diet with thin liquids. No report of swallowing deficits. Skilled ST ordered to evaluate current diet and communication abilities.  Type of Study: Bedside Swallow Evaluation Previous Swallow Assessment: N/A Diet Prior to this Study: Regular;Thin liquids Temperature Spikes Noted: No Respiratory Status: Room air History of Recent Intubation: No Behavior/Cognition: Confused;Lethargic/Drowsy;Requires cueing;Doesn't follow directions Oral Cavity Assessment: Within Functional Limits Oral Care Completed by SLP: No Oral Cavity - Dentition: Adequate natural dentition Vision:  (N/A) Self-Feeding Abilities: Total assist Patient Positioning: Upright in bed Baseline Vocal Quality: Normal (Minimally verbal this session) Volitional Cough: Cognitively unable to elicit Volitional Swallow: Unable to elicit    Oral/Motor/Sensory Function Overall Oral Motor/Sensory Function: Moderate impairment Facial ROM: Reduced right Facial Symmetry: Abnormal symmetry right Facial Strength: Reduced right Facial Sensation:  (Unable to determine) Lingual ROM:  (Unable to determine, pt unable to protrude tongue) Lingual Symmetry:  (unable to determine) Lingual Strength: Reduced Lingual Sensation: Reduced   Ice Chips Ice chips: Not tested   Thin Liquid Thin Liquid: Within functional limits Presentation: Cup (ST fed, 6 sips)    Nectar Thick Nectar Thick Liquid: Not tested   Honey Thick Honey Thick Liquid: Not tested   Puree Puree: Not tested   Solid   GO   Solid: Impaired Presentation: Spoon (ST fed, diced peaches) Oral Phase Impairments: Reduced labial seal;Reduced lingual movement/coordination;Poor awareness of bolus;Impaired mastication Oral  Phase Functional Implications: Right anterior spillage;Prolonged oral transit;Impaired mastication        Aprill Banko 08/04/2015,3:25 PM

## 2015-08-04 NOTE — NC FL2 (Signed)
[place] LEVEL OF CARE SCREENING TOOL     IDENTIFICATION  Patient Name: Samuel Wagner Birthdate: [birth year] Sex: male Admission Date (Current Location): 08/03/2015  [place] and [place] Number:  Engineering geologist and Address:  [facility], [address] [place], [place] [address]      Provider Number: [id number]  Attending Physician Name and Address:  Theodoro Grist, MD  Relative Name and Phone Number:       Current Level of Care: Hospital Recommended Level of Care: [place] Prior Approval Number:    Date Approved/Denied:   PASRR Number:  ([id number])  Discharge Plan: SNF    Current Diagnoses: Patient Active Problem List   Diagnosis Date Noted  . Generalized weakness 08/03/2015  . UTI (lower urinary tract infection) 08/03/2015  . Arteriosclerosis of coronary artery 10/01/2014  . HLD (hyperlipidemia) 10/01/2014  . BP (high blood pressure) 10/01/2014  . Closed arm fracture 11/06/2013  . Glenoid fracture of shoulder 10/15/2013  . Chronic a-fib ([place]) 2020-08-2513  . DVT (deep venous thrombosis) ([place]) 2020-08-2513  . Late effects of CVA (cerebrovascular accident) 2020-08-2513  . Chronic anticoagulation 2020-08-2513  . Essential hypertension, benign 2020-08-2513  . E-coli UTI 2020-08-2513  . GERD (gastroesophageal reflux disease) 2020-08-2513  . Physical deconditioning 2020-08-2513  . Arthritis of knee, degenerative 07/31/2013  . A-fib ([place]) 05/20/2013  . Acute cystitis 12/12/2011  . Incomplete bladder emptying 12/12/2011  . CA of prostate ([place]) 12/12/2011  . Flu vaccine need 12/12/2011  . 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 Excessive urination at night 12/12/2011  . Infection of urinary tract 12/12/2011  . Urge incontinence 12/12/2011    Orientation RESPIRATION BLADDER Height & Weight     Self  Normal Incontinent Weight: 194 lb (87.998 kg) Height:  6' (182.9 cm)  BEHAVIORAL  SYMPTOMS/MOOD NEUROLOGICAL BOWEL NUTRITION STATUS   (none )  (none ) Continent Diet (Diet: Heart Healthy )  AMBULATORY STATUS COMMUNICATION OF NEEDS Skin   Extensive Assist Verbally Normal                       Personal Care Assistance Level of Assistance  Bathing, Feeding, Dressing Bathing Assistance: Limited assistance Feeding assistance: Independent Dressing Assistance: Limited assistance     Functional Limitations Info  Sight, Hearing, Speech Sight Info: Adequate Hearing Info: Adequate Speech Info: Adequate    SPECIAL CARE FACTORS FREQUENCY  PT (By licensed PT), OT (By licensed OT)     PT Frequency:  (5) OT Frequency:  (5)            Contractures      Additional Factors Info  Code Status, Allergies Code Status Info:  (DNR ) Allergies Info:  (Sulfa Antibiotics, Sulfacetamide Sodium)           Current Medications (08/04/2015):  This is the current hospital active medication list Current Facility-Administered Medications  Medication Dose Route Frequency Provider Last Rate Last Dose  . acetaminophen (TYLENOL) tablet 650 mg  650 mg Oral Q6H PRN Lance Coon, MD       Or  . acetaminophen (TYLENOL) suppository 650 mg  650 mg Rectal Q6H PRN Lance Coon, MD      . aspirin chewable tablet 81 mg  81 mg Oral Daily Lance Coon, MD   81 mg at 08/04/15 0919  . cefTRIAXone (ROCEPHIN) 2 g in dextrose 5 % 50 mL IVPB  2 g Intravenous Q24H Lance Coon, MD  2 g at 08/04/15 0920  . digoxin (LANOXIN) tablet 0.25 mg  0.25 mg Oral Daily Lance Coon, MD   0.25 mg at 08/04/15 0919  . HYDROcodone-acetaminophen (NORCO/VICODIN) 5-325 MG per tablet 1-2 tablet  1-2 tablet Oral Q4H PRN Lance Coon, MD   1 tablet at 08/04/15 0537  . metoprolol tartrate (LOPRESSOR) tablet 25 mg  25 mg Oral BID Lance Coon, MD   25 mg at 08/04/15 0919  . ondansetron (ZOFRAN) tablet 4 mg  4 mg Oral Q6H PRN Lance Coon, MD       Or  . ondansetron [facility]) injection 4 mg  4 mg Intravenous Q6H PRN Lance Coon, MD      . pantoprazole (PROTONIX) EC tablet 40 mg  40 mg Oral Daily Lance Coon, MD   40 mg at 08/04/15 0919  . sodium chloride flush (NS) 0.9 % injection 3 mL  3 mL Intravenous Q12H Lance Coon, MD   3 mL at 08/04/15 0924  . [START ON 08/05/2015] warfarin (COUMADIN) tablet 4 mg  4 mg Oral Once per day on Mon Wed Fri Lance Coon, MD      . warfarin (COUMADIN) tablet 5 mg  5 mg Oral Once per day on Sun Tue Thu Sat Lance Coon, MD         Discharge Medications: Please see discharge summary for a list of discharge medications.  Relevant Imaging Results:  Relevant Lab Results:   Additional Information  (SSN: [id number])  Nyree Yonker, Veronia Beets, LCSW

## 2015-08-04 NOTE — Progress Notes (Signed)
Dr. Ether Griffins aware that patient spike fever, tylenol suppository given. Will continue to monitor. Transfer to 1C, per MD - palliative and neuro to follow. Family at bedside, questions answered, in good spirits.

## 2015-08-04 NOTE — Progress Notes (Signed)
Son at bedside, concerned that patient's speech is more slurred and upper extremities are weaker and having trouble with fine motor skills. Dr. Ether Griffins notified - she will order MRI. Instructed this RN to update son at bedside.  Will continue to monitor.

## 2015-08-04 NOTE — Progress Notes (Signed)
Patient having mild tremors intermittently from head to toes. States he feels funny but can't explain how, still confused. Son at bedside. Paged Dr. Ether Griffins - states these feelings/tremors are expected with this type of stroke. Will get SLP involved to assess his swallowing ability. MD to call son to inform him of the stroke and plan of care. No new orders at this time.

## 2015-08-04 NOTE — Progress Notes (Signed)
ANTICOAGULATION CONSULT NOTE - Initial Consult  Pharmacy Consult for warfarin dosing Indication: atrial fibrillation  Allergies  Allergen Reactions  . Sulfa Antibiotics Rash    Other reaction(s): RASH  . Sulfacetamide Sodium Rash    Patient Measurements: Height: 6' (182.9 cm) Weight: 194 lb (87.998 kg) IBW/kg (Calculated) : 77.6 Heparin Dosing Weight: n/a  Vital Signs: Temp: 98.8 F (37.1 C) (07/13 0027) Temp Source: Oral (07/13 0027) BP: 162/43 mmHg (07/13 0027) Pulse Rate: 86 (07/13 0027)  Labs:  Recent Labs  08/03/15 2047  HGB 13.2  HCT 39.0*  PLT 207  LABPROT 25.2*  INR 2.32  CREATININE 1.15  TROPONINI 0.03*    Estimated Creatinine Clearance: 41.2 mL/min (by C-G formula based on Cr of 1.15).   Medical History: Past Medical History  Diagnosis Date  . A-fib ([place])   . Acute renal failure ([place])   . DVT (deep venous thrombosis) ([place])   . Hypokalemia   . Hypertension   . Prostate cancer ([place])   . Stroke ([place])   . CAD (coronary artery disease)   . Hyperlipidemia   . Arthritis   . Pulmonary embolism ([place])   . Fall at home   . Rectal incontinence   . OAB (overactive bladder)   . Arrhythmia   . Nocturia   . Urge incontinence   . Neoplasm of uncertain behavior of kidney   . Acute cystitis   . Incomplete bladder emptying   . Rosacea   . Peripheral neuropathy ([place])   . DJD (degenerative joint disease)   . Vertigo   . Shingles   . CVD (cardiovascular disease)     Medications:    Assessment: INR 2.32 Goal of Therapy:  INR 2-3    Plan:  Home regimen of 4 mg M,W,F and 5 mg T,T,S,S ordered. Daily INRs ordered while patient is on antibiotics.  Rashad Obeid S 08/04/2015,1:15 AM

## 2015-08-04 NOTE — Progress Notes (Signed)
[place] [facility] [place] NAME: Samuel Wagner    MR#:  [id number]  DATE OF BIRTH:  1916/02/02  SUBJECTIVE:  CHIEF COMPLAINT:   Chief Complaint  Patient presents with  . Weakness  Patient is a 82 year old Caucasian male status post history significant for history of A. fib, DVT, hypokalemia, hypertension, stroke, coronary artery disease, hyperlipidemia, PE, who presents to the hospital with complaints of generalized weakness, worsening over the past 1-2 days. On arrival to the hospital patient was noted to have pyuria, admitted for urinary tract infection and associated weakness. Patient denies any pain today, unable to review systems due to dementia/medical condition  Review of Systems  Unable to perform ROS: medical condition    VITAL SIGNS: Blood pressure 128/46, pulse 90, temperature 97.6 F (36.4 C), temperature source Oral, resp. rate 18, height 6' (1.829 m), weight 87.998 kg (194 lb), SpO2 97 %.  PHYSICAL EXAMINATION:   GENERAL:  82 y.o.-year-old patient lying in the bed with no acute distress, slow to respond, has masklike face, able to answer a few questions appropriately, follows some commands.  EYES: Pupils equal, round, reactive to light and accommodation. No scleral icterus. Extraocular muscles intact.  HEENT: Head atraumatic, normocephalic. Oropharynx and nasopharynx clear.  NECK:  Supple, no jugular venous distention. No thyroid enlargement, no tenderness.  LUNGS: Normal breath sounds bilaterally, no wheezing, rales,rhonchi or crepitation. No use of accessory muscles of respiration.  CARDIOVASCULAR: S1, S2 normal. No murmurs, rubs, or gallops.  ABDOMEN: Soft, nontender, nondistended. Bowel sounds present. No organomegaly or mass.  EXTREMITIES: No pedal edema, cyanosis, or clubbing.  NEUROLOGIC: Cranial nerves II through XII are intact. Muscle strength 4/5 in upper extremities, 3 out of 5 in the left lower extremity, 4 out of  5 in right lower extremity, although attempts are inconsistent. Sensation grossly intact. Gait not checked. Patient is able to speak, low voice, minimal facial muscle movements, tongue is midline PSYCHIATRIC: The patient is alert , difficult to assess orientation due to poor interaction.  SKIN: No obvious rash, lesion, or ulcer.   ORDERS/RESULTS REVIEWED:   CBC  Recent Labs Lab 08/03/15 2047 08/04/15 0548  WBC 10.5 11.2*  HGB 13.2 12.4*  HCT 39.0* 36.3*  PLT 207 184  MCV 90.5 90.9  MCH 30.6 31.0  MCHC 33.8 34.2  RDW 15.2* 15.4*  LYMPHSABS 2.1  --   MONOABS 0.9  --   EOSABS 0.1  --   BASOSABS 0.0  --    ------------------------------------------------------------------------------------------------------------------  Chemistries   Recent Labs Lab 08/03/15 2047 08/04/15 0548  NA 138 137  K 4.0 3.7  CL 104 105  CO2 23 24  GLUCOSE 140* 153*  BUN 26* 19  CREATININE 1.15 0.90  CALCIUM 9.2 8.5*  AST 20  --   ALT 11*  --   ALKPHOS 53  --   BILITOT 0.8  --    ------------------------------------------------------------------------------------------------------------------ estimated creatinine clearance is 52.7 mL/min (by C-G formula based on Cr of 0.9). ------------------------------------------------------------------------------------------------------------------ No results for input(s): TSH, T4TOTAL, T3FREE, THYROIDAB in the last 72 hours.  Invalid input(s): FREET3  Cardiac Enzymes  Recent Labs Lab 08/03/15 2047  TROPONINI 0.03*   ------------------------------------------------------------------------------------------------------------------ Invalid input(s): POCBNP ---------------------------------------------------------------------------------------------------------------  RADIOLOGY: Dg Chest 2 View  08/03/2015  CLINICAL DATA:  Generalize weakness for couple days. Difficulty walking. Fever. 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 No change in appearance since the previous chest radiograph. Heart size is normal. Calcification of the aorta. Linear atelectasis in the lung bases. No focal consolidation. No blunting of costophrenic angles. No pneumothorax. Degenerative changes in the spine and shoulders. IMPRESSION: Stable appearance of right cardiophrenic angle lesion corresponding to a cyst on prior CT. No evidence of active pulmonary disease. Aortic atherosclerosis. Electronically Signed   By: Lucienne Capers M.D.   On: 08/03/2015 22:13    EKG:  Orders placed or performed during the hospital encounter of 08/03/15  . EKG 12-Lead  . EKG 12-Lead    ASSESSMENT AND PLAN:  Principal Problem:   UTI (lower urinary tract infection) Active Problems:   Chronic a-fib (HCC)   Essential hypertension, benign   GERD (gastroesophageal reflux disease)   Generalized weakness #1. Urinary tract infection, continue patient on Rocephin, urinary cultures are pending, blood cultures are negative so far #2. Left lower extremity weakness, questionable inconsistent performance versus stroke, getting MRI of the brain, continue patient on aspirin, add Lipitor. If positive for stroke, get physical therapist involved #3. Essential hypertension, well-controlled, continue current medications   #4. Atrial fibrillation, chronic, heart rate is well controlled, patient is anticoagulated with Coumadin, therapeutic, follow in the morning #5. Generalized weakness, getting physical therapist involved for further recommendations #6 slurry speech, questionable stroke, getting MRI, getting speech therapist involved   Management plans discussed with the patient, family and they are in agreement.   DRUG ALLERGIES:  Allergies  Allergen Reactions  . Sulfa Antibiotics Rash    Other reaction(s): RASH  . Sulfacetamide Sodium Rash    CODE STATUS:     Code  Status Orders        Start     Ordered   08/04/15 0931  Do not attempt resuscitation (DNR)   Continuous    Question Answer Comment  In the event of cardiac or respiratory ARREST Do not call a "code blue"   In the event of cardiac or respiratory ARREST Do not perform Intubation, CPR, defibrillation or ACLS   In the event of cardiac or respiratory ARREST Use medication by any route, position, wound care, and other measures to relive pain and suffering. May use oxygen, suction and manual treatment of airway obstruction as needed for comfort.      08/04/15 0930    Code Status History    Date Active Date Inactive Code Status Order ID Comments User Context   08/03/2015 11:56 PM 08/04/2015  9:30 AM Full Code [id number]  Lance Coon, MD ED    Advance Directive Documentation        Most Recent Value   Type of Advance Directive  Healthcare Power of Attorney, Living will, Out of facility DNR (pink MOST or yellow form)   Pre-existing out of facility DNR order (yellow form or pink MOST form)     "MOST" Form in Place?        TOTAL TIME TAKING CARE OF THIS PATIENT: 40 minutes.    Theodoro Grist M.D on 08/04/2015 at 12:52 PM  Between 7am to 6pm - Pager - [phone]  After 6pm go to [email or web address] - password [email or web address] [facility]  [facility]  Office  [phone]  CC: Primary care physician; Leonel Ramsay, MD

## 2015-08-04 NOTE — Progress Notes (Signed)
PT Cancellation Note  Patient Details Name: Samuel Wagner MRN: [id number] DOB: February 27, 1916   Cancelled Treatment:    Reason Eval/Treat Not Completed: Patient not medically ready (Consult received and chart reviewed.  Patient noted with positive CVA (acute pontine infarct) per MRI; additional work-up (neuro, palliative care consults) pending.  RN recommends hold at this time with re-attempt next date.  will continue efforts as medically appropriate.)   Yessica Putnam H. Owens Shark, PT, DPT, NCS 08/04/2015, 3:25 PM [phone]

## 2015-08-04 NOTE — Clinical Social Work Placement (Signed)
   CLINICAL SOCIAL WORK PLACEMENT  NOTE  Date:  08/04/2015  Patient Details  Name: Samuel Wagner MRN: [id number] Date of Birth: [birth year]  Clinical Social Work is seeking post-discharge placement for this patient at the [place] level of care (*CSW will initial, date and re-position this form in  chart as items are completed):  Yes   Patient/family provided with [place] Work Department's list of facilities offering this level of care within the geographic area requested by the patient (or if unable, by the patient's family).  Yes   Patient/family informed of their freedom to choose among providers that offer the needed level of care, that participate in Medicare, Medicaid or managed care program needed by the patient, have an available bed and are willing to accept the patient.  Yes   Patient/family informed of [place]'s ownership interest in [facility] and [facility], as well as of the fact that they are under no obligation to receive care at these facilities.  PASRR submitted to EDS on       PASRR number received on       Existing PASRR number confirmed on 08/04/15     FL2 transmitted to all facilities in geographic area requested by pt/family on 08/04/15     FL2 transmitted to all facilities within larger geographic area on       Patient informed that his/her managed care company has contracts with or will negotiate with certain facilities, including the following:            Patient/family informed of bed offers received.  Patient chooses bed at       Physician recommends and patient chooses bed at      Patient to be transferred to   on  .  Patient to be transferred to facility by       Patient family notified on   of transfer.  Name of family member notified:        PHYSICIAN       Additional Comment:    _______________________________________________ Carlisha Wisler, Veronia Beets, LCSW 08/04/2015, 12:10 PM

## 2015-08-04 NOTE — Progress Notes (Signed)
Patient transferred to 122. Report called to [facility] RN. Fever starting to come down slowly. NIH attempted - trouble following commands, very weak. Belongings taken to new room. Son at bedside.

## 2015-08-04 NOTE — Progress Notes (Signed)
Pharmacy Antibiotic Note  Samuel Wagner is a 82 y.o. male admitted on 08/03/2015 with UTI.  Pharmacy has been consulted for ceftriaxone dosing.  Plan: Ceftriaxone 2 grams q 24 hours ordered.  Height: 6' (182.9 cm) Weight: 194 lb (87.998 kg) IBW/kg (Calculated) : 77.6  Temp (24hrs), Avg:99.2 F (37.3 C), Min:98.8 F (37.1 C), Max:99.4 F (37.4 C)   Recent Labs Lab 08/03/15 2047 08/03/15 2117  WBC 10.5  --   CREATININE 1.15  --   LATICACIDVEN  --  1.6    Estimated Creatinine Clearance: 41.2 mL/min (by C-G formula based on Cr of 1.15).    Allergies  Allergen Reactions  . Sulfa Antibiotics Rash    Other reaction(s): RASH  . Sulfacetamide Sodium Rash    Antimicrobials this admission: ceftriaxone  >>    >>   Dose adjustments this admission:   Microbiology results: 7/12 BCx: pending 7/12 UCx: pending    7/12 UA: LE(+) NO2(+) WBC TNTC  Thank you for allowing pharmacy to be a part of this patient's care.  Mishti Swanton S 08/04/2015 1:14 AM

## 2015-08-05 DIAGNOSIS — I639 Cerebral infarction, unspecified: Secondary | ICD-10-CM

## 2015-08-05 DIAGNOSIS — N390 Urinary tract infection, site not specified: Secondary | ICD-10-CM

## 2015-08-05 LAB — LIPID PANEL
Cholesterol: 184 mg/dL (ref 0–200)
HDL: 34 mg/dL — ABNORMAL LOW (ref >40)
LDL CALC: 107 mg/dL — AB (ref 0–99)
Total CHOL/HDL Ratio: 5.4 RATIO
Triglycerides: 216 mg/dL — ABNORMAL HIGH (ref <150)
VLDL: 43 mg/dL — ABNORMAL HIGH (ref 0–40)

## 2015-08-05 LAB — HEMOGLOBIN A1C: Hgb A1c MFr Bld: 6.6 % — ABNORMAL HIGH (ref 4.0–6.0)

## 2015-08-05 LAB — PROTIME-INR
INR: 2.11
PROTHROMBIN TIME: 23.5 s — AB (ref 11.4–15.0)

## 2015-08-05 MED ORDER — ATORVASTATIN CALCIUM 20 MG PO TABS
40.0000 mg | ORAL_TABLET | Freq: Every day | ORAL | Status: DC
  Administered 2015-08-05 – 2015-08-07 (×3): 40 mg via ORAL
  Filled 2015-08-05 (×3): qty 2

## 2015-08-05 MED ORDER — ASPIRIN 300 MG RE SUPP
300.0000 mg | Freq: Every day | RECTAL | Status: DC
  Administered 2015-08-05: 09:00:00 300 mg via RECTAL
  Filled 2015-08-05: qty 1

## 2015-08-05 MED ORDER — WARFARIN SODIUM 5 MG PO TABS
5.0000 mg | ORAL_TABLET | ORAL | Status: DC
  Administered 2015-08-06: 18:00:00 5 mg via ORAL
  Filled 2015-08-05: qty 1

## 2015-08-05 MED ORDER — PANTOPRAZOLE SODIUM 40 MG PO PACK
40.0000 mg | PACK | Freq: Every day | ORAL | Status: DC
  Administered 2015-08-05 – 2015-08-08 (×4): 40 mg via ORAL
  Filled 2015-08-05 (×3): qty 20

## 2015-08-05 MED ORDER — WARFARIN SODIUM 4 MG PO TABS
4.0000 mg | ORAL_TABLET | ORAL | Status: DC
  Administered 2015-08-05: 18:00:00 4 mg via ORAL
  Filled 2015-08-05: qty 1

## 2015-08-05 MED ORDER — WARFARIN - PHARMACIST DOSING INPATIENT
Freq: Every day | Status: DC
  Administered 2015-08-05 – 2015-08-07 (×3)

## 2015-08-05 MED ORDER — POTASSIUM CHLORIDE IN NACL 20-0.9 MEQ/L-% IV SOLN
INTRAVENOUS | Status: DC
  Administered 2015-08-05 – 2015-08-06 (×3): via INTRAVENOUS
  Filled 2015-08-05 (×7): qty 1000

## 2015-08-05 MED ORDER — ASPIRIN 81 MG PO CHEW
324.0000 mg | CHEWABLE_TABLET | Freq: Once | ORAL | Status: AC
  Administered 2015-08-06: 324 mg via ORAL
  Filled 2015-08-05: qty 4

## 2015-08-05 MED ORDER — DEXTROSE 5 % IV SOLN
1.0000 g | INTRAVENOUS | Status: DC
  Administered 2015-08-06 – 2015-08-08 (×3): 1 g via INTRAVENOUS
  Filled 2015-08-05 (×3): qty 10

## 2015-08-05 MED ORDER — METOPROLOL TARTRATE 25 MG PO TABS
12.5000 mg | ORAL_TABLET | Freq: Two times a day (BID) | ORAL | Status: DC
  Administered 2015-08-05 – 2015-08-07 (×5): 12.5 mg via ORAL
  Filled 2015-08-05 (×5): qty 1

## 2015-08-05 MED ORDER — DIGOXIN 125 MCG PO TABS
0.2500 mg | ORAL_TABLET | Freq: Every day | ORAL | Status: DC
  Administered 2015-08-05 – 2015-08-07 (×3): 0.25 mg via ORAL
  Filled 2015-08-05 (×3): qty 2

## 2015-08-05 NOTE — Progress Notes (Signed)
Speech Language Pathology Treatment: Dysphagia  Patient Details Name: Samuel Wagner MRN: [id number] DOB: 15-Feb-1916 Today's Date: 08/05/2015 Time: 1230-1300 SLP Time Calculation (min) (ACUTE ONLY): 30 min  Assessment / Plan / Recommendation Clinical Impression  Nursing consulted and was made aware of pt's recent NPO order d/t decreased alertness. ST re-contacted by nursing and informed that pt was more alert and PO trials may be appropriate. ST entered room and pt up in chair and alert. Able to look at [place] and keep eyes open for trials of PO. Pt able to communicate minimally (yes/no) basic/simple. Trials of honey thick given by ST with 3 to 4 second swallow delay. No overt s/s of aspirtion. Voice remained clear throughout. Pt with consistent 3 to 4 second delay. Trials of puree were effectively cleared but with continued swallow delay of 3 to 4 seconds. Trials of thin liquids yielded immediate coughing. ST spoke with MD about results of PO trials. MD request pt to return to PO intake with puree and honey thick as long as pt is awake/alert. Education provided to nursing and son. Prognosis for safe PO intake for nutritional support continues to be guarded.     HPI HPI: Patient is a 82 year old Caucasian male status post history significant for history of A. fib, DVT, hypokalemia, hypertension, stroke, coronary artery disease, hyperlipidemia, PE, who presents to the hospital with complaints of generalized weakness, worsening over the past 1-2 days. On arrival to the hospital patient was noted to have pyuria, admitted for urinary tract infection and associated weakness. Patient denies any pain today, unable to review systems due to dementia/medical condition. Pt currently on regular diet with thin liquids. No report of swallowing deficits. Skilled ST ordered to evaluate current diet and communication abilities.       SLP Plan  Continue with current plan of care     Recommendations  Diet  recommendations: Dysphagia 1 (puree);Honey-thick liquid Liquids provided via: Teaspoon Medication Administration: Crushed with puree Supervision: Full supervision/cueing for compensatory strategies Compensations: Minimize environmental distractions;Slow rate;Small sips/bites;Follow solids with liquid Postural Changes and/or Swallow Maneuvers: Seated upright 90 degrees             Oral Care Recommendations: Oral care BID Follow up Recommendations: 24 hour supervision/assistance;Skilled Nursing facility Plan: Continue with current plan of care     [place] 08/05/2015, 1:07 PM

## 2015-08-05 NOTE — Progress Notes (Signed)
[facility] [place] NAME: Samuel Wagner    MR#:  [id number]  DATE OF BIRTH:  23-Jan-1916  SUBJECTIVE:  CHIEF COMPLAINT:   Chief Complaint  Patient presents with  . Weakness  Patient is a 82 year old Caucasian male status post history significant for history of A. fib, DVT, hypokalemia, hypertension, stroke, coronary artery disease, hyperlipidemia, PE, who presents to the hospital with complaints of generalized weakness, worsening over the past 1-2 days. On arrival to the hospital patient was noted to have pyuria, admitted for urinary tract infection and associated weakness. Patient More obtunded today, does not open his eyes, nonverbal, not able to review systems.      Review of Systems  Unable to perform ROS: medical condition    VITAL SIGNS: Blood pressure 127/46, pulse 58, temperature 99.8 F (37.7 C), temperature source Oral, resp. rate 20, height 6' (1.829 m), weight 87.998 kg (194 lb), SpO2 95 %.  PHYSICAL EXAMINATION:   GENERAL:  82 y.o.-year-old patient lying in the bed with no acute distress, slow to respond, masklike face, unable to answer questions , all however, was able to follow some commands, moves upper extremities equally, minimal lower extremity movements, nonverbal.  EYES: Pupils equal, round, reactive to light and accommodation. No scleral icterus. Extraocular muscles intact.  HEENT: Head atraumatic, normocephalic. Oropharynx and nasopharynx clear.  NECK:  Supple, no jugular venous distention. No thyroid enlargement, no tenderness.  LUNGS: Normal breath sounds bilaterally, no wheezing, rales,rhonchi or crepitation. No use of accessory muscles of respiration.  CARDIOVASCULAR: S1, S2 normal. No murmurs, rubs, or gallops.  ABDOMEN: Soft, nontender, nondistended. Bowel sounds present. No organomegaly or mass.  EXTREMITIES: No pedal edema, cyanosis, or clubbing.  NEUROLOGIC: Cranial nerves II through XII are grossly  intact. Muscle strength 4/5 in upper extremities, 1 out of 5 in the lower extremities,  Sensation , unable to assess. Gait not checked. Patient is unable to speak today,  minimal facial muscle movements, although was able to smile, unable to protrude tongue PSYCHIATRIC: The patient is very somnolent, difficult to arouse, nonverbal, although able to comply with  few commands SKIN: No obvious rash, lesion, or ulcer.   ORDERS/RESULTS REVIEWED:   CBC  Recent Labs Lab 08/03/15 2047 08/04/15 0548  WBC 10.5 11.2*  HGB 13.2 12.4*  HCT 39.0* 36.3*  PLT 207 184  MCV 90.5 90.9  MCH 30.6 31.0  MCHC 33.8 34.2  RDW 15.2* 15.4*  LYMPHSABS 2.1  --   MONOABS 0.9  --   EOSABS 0.1  --   BASOSABS 0.0  --    ------------------------------------------------------------------------------------------------------------------  Chemistries   Recent Labs Lab 08/03/15 2047 08/04/15 0548  NA 138 137  K 4.0 3.7  CL 104 105  CO2 23 24  GLUCOSE 140* 153*  BUN 26* 19  CREATININE 1.15 0.90  CALCIUM 9.2 8.5*  AST 20  --   ALT 11*  --   ALKPHOS 53  --   BILITOT 0.8  --    ------------------------------------------------------------------------------------------------------------------ estimated creatinine clearance is 52.7 mL/min (by C-G formula based on Cr of 0.9). ------------------------------------------------------------------------------------------------------------------ No results for input(s): TSH, T4TOTAL, T3FREE, THYROIDAB in the last 72 hours.  Invalid input(s): FREET3  Cardiac Enzymes  Recent Labs Lab 08/03/15 2047  TROPONINI 0.03*   ------------------------------------------------------------------------------------------------------------------ Invalid input(s): POCBNP ---------------------------------------------------------------------------------------------------------------  RADIOLOGY: Dg Chest 2 View  08/03/2015  CLINICAL DATA:  Generalize weakness for couple days.  Difficulty walking. Fever. 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 No change in appearance since the previous chest radiograph. Heart size is normal. Calcification of the aorta. Linear atelectasis in the lung bases. No focal consolidation. No blunting of costophrenic angles. No pneumothorax. Degenerative changes in the spine and shoulders. IMPRESSION: Stable appearance of right cardiophrenic angle lesion corresponding to a cyst on prior CT. No evidence of active pulmonary disease. Aortic atherosclerosis. Electronically Signed   By: Lucienne Capers M.D.   On: 08/03/2015 22:13   Mr Brain Wo Contrast  08/04/2015  CLINICAL DATA:  Weakness for 1-2 days. Urinary tract infection. Left leg weakness. EXAM: MRI HEAD WITHOUT CONTRAST TECHNIQUE: Multiplanar, multiecho pulse sequences of the brain and surrounding structures were obtained without intravenous contrast. COMPARISON:  Head CT 10/08/2013 FINDINGS: Brain Parenchyma: There is focal diffusion restriction within the right pons, measuring approximately 1.2 cm in maximum dimension. This associated with hyperintense T2 weighted signal. No evidence of acute hemorrhage. There is mild periventricular hyperintense T2 weighted signal for age. No mass lesion or midline shift. The major intracranial flow voids are preserved. Ventricles, Sulci and Extra-axial Spaces: No hydrocephalus or age advanced atrophy. No extra-axial collection. Paranasal Sinuses and Mastoids: No fluid levels or advanced mucosal thickening. Orbits: Bilateral lens replacements. Bones and Soft Tissues: The visualized skull base, calvarium and extracranial soft tissues are normal. IMPRESSION: Acute right pontine infarct. No associated hemorrhage or significant mass effect. Critical Value/emergent results were called by telephone at the time of interpretation on 08/04/2015 at 1:58 pm to Dr.  Theodoro Grist , who verbally acknowledged these results. Electronically Signed   By: Ulyses Jarred M.D.   On: 08/04/2015 14:00    EKG:  Orders placed or performed during the hospital encounter of 08/03/15  . EKG 12-Lead  . EKG 12-Lead    ASSESSMENT AND PLAN:  Principal Problem:   UTI (lower urinary tract infection) Active Problems:   Chronic a-fib (HCC)   Essential hypertension, benign   GERD (gastroesophageal reflux disease)   Generalized weakness #1. Acute pontine stroke with worsening mental status, left lower extremity weakness, discussed with patient's son overall  poor prognosis due to patient's age, palliative care consultation was requested.  Neurologist consultation is appreciated, she feels that stroke is likely embolic, does not recommend echo, carotid ultrasound. Continue patient on aspirin rectally, heparin intravenously. When patient's INR falls below 2.0, unless patient is allowed to take  oral Coumadin, following ProTime/INR closely. Check hemoglobin A1c, lipid panel, unable to take a statin due to inability to take medications orally. speech therapist reevaluation is pending. Discussed this physical therapist, will hold off physical therapy at this time due to patient being poorly functional/responsive #2. Urinary tract infection due to Escherichia coli, continue patient on Rocephin, urinary cultures showed Escherichia coli, sensitivities are pending, blood cultures are negative so far. Patient was febrile to 102 yesterday and remains febrile today, continue Tylenol, Advil suppositories #3. Essential hypertension,  blood pressure is relatively low, patient's blood pressure medications as suspended, IV fluids administered, follow blood pressure readings closely  #4. Atrial fibrillation, chronic, heart rate is well controlled, patient is anticoagulated with Coumadin, therapeutic, follow in the morning, initiate patient on heparin intravenously when patient's pro time INR is below  2.0, since patient's stroke is considered to be embolic, initiate patient on metoprolol as well as digoxin intravenously if tachycardic and is able to take medications orally.  #5. Generalized weakness, physical therapist recommendations are pending,  although patient is poorly responsive and will not be able to follow commands at this time, so physical therapist evaluation is suspended, discussed this physical therapist today.  #6 slurry speech,  worsened condition today, speech therapist involved , patient will be reevaluated this weekend. Palliative care is consulted  Management plans discussed with the patient, family and they are in agreement.   DRUG ALLERGIES:  Allergies  Allergen Reactions  . Sulfa Antibiotics Rash    Other reaction(s): RASH  . Sulfacetamide Sodium Rash    CODE STATUS:     Code Status Orders        Start     Ordered   08/04/15 0931  Do not attempt resuscitation (DNR)   Continuous    Question Answer Comment  In the event of cardiac or respiratory ARREST Do not call a "code blue"   In the event of cardiac or respiratory ARREST Do not perform Intubation, CPR, defibrillation or ACLS   In the event of cardiac or respiratory ARREST Use medication by any route, position, wound care, and other measures to relive pain and suffering. May use oxygen, suction and manual treatment of airway obstruction as needed for comfort.      08/04/15 0930    Code Status History    Date Active Date Inactive Code Status Order ID Comments User Context   08/03/2015 11:56 PM 08/04/2015  9:30 AM Full Code [id number]  Lance Coon, MD ED    Advance Directive Documentation        Most Recent Value   Type of Advance Directive  Healthcare Power of Attorney, Living will, Out of facility DNR (pink MOST or yellow form)   Pre-existing out of facility DNR order (yellow form or pink MOST form)     "MOST" Form in Place?        TOTAL TIME TAKING CARE OF THIS PATIENT: 40 minutes.     Theodoro Grist M.D on 08/05/2015 at 11:36 AM  Between 7am to 6pm - Pager - [phone]  After 6pm go to [email or web address] - password [email or web address] [facility]  [facility]  Office  [phone]  CC: Primary care physician; Leonel Ramsay, MD

## 2015-08-05 NOTE — Progress Notes (Signed)
PT Cancellation Note  Patient Details Name: Samuel Wagner MRN: [id number] DOB: 1916/04/05   Cancelled Treatment:    Reason Eval/Treat Not Completed: Medical issues which prohibited therapy (Consult received and chart reviewed.  Per primary MD Charissa Bash), patient with decreased alertness/responsiveness, unable to participate with PT evalution this date.  Palliative consult pending.  Recommends hold this date with re-attempt next date if appropriate as goals of care established and patient medically appropriate/able to participate.)   Yousif Edelson H. Owens Shark, PT, DPT, NCS 08/05/2015, 10:49 AM [phone]

## 2015-08-05 NOTE — Progress Notes (Signed)
Pharmacy Antibiotic Note  Samuel Wagner is a 82 y.o. male admitted on 08/03/2015 with UTI.  Pharmacy has been consulted for ceftriaxone dosing.  Plan: Will decrease dose to ceftriaxone 1g q 24 hours. Bcx neg. Ucx with ecoli, sensitivities pending.  Height: 6' (182.9 cm) Weight: 194 lb (87.998 kg) IBW/kg (Calculated) : 77.6  Temp (24hrs), Avg:99.7 F (37.6 C), Min:98.1 F (36.7 C), Max:102.1 F (38.9 C)   Recent Labs Lab 08/03/15 2047 08/03/15 2117 08/04/15 0548  WBC 10.5  --  11.2*  CREATININE 1.15  --  0.90  LATICACIDVEN  --  1.6  --     Estimated Creatinine Clearance: 52.7 mL/min (by C-G formula based on Cr of 0.9).    Allergies  Allergen Reactions  . Sulfa Antibiotics Rash    Other reaction(s): RASH  . Sulfacetamide Sodium Rash    Antimicrobials this admission: ceftriaxone  >>    >>   Dose adjustments this admission: Ceftriaxone 2g q 24> ceftriaxone 1g q 24  Microbiology results: 7/12 BCx: no growth  7/12 UCx: ecoli   7/12 UA: LE(+) NO2(+) WBC TNTC  Thank you for allowing pharmacy to be a part of this patient's care.  Ramond Dial 08/05/2015 12:42 PM

## 2015-08-05 NOTE — Progress Notes (Signed)
ST consulted with nursing who advises that pt is NPO per MD d/t decreased alertness. Skilled session is inappropriate at this time. Will await further direction from MD before attempting PO intake. Education provided to nursing on general aspiration precautions if/when pt becomes more alert. Will follow up when pt condition changes.

## 2015-08-05 NOTE — Progress Notes (Signed)
ANTICOAGULATION CONSULT NOTE - Initial Consult  Pharmacy Consult for warfarin dosing Indication: atrial fibrillation  Allergies  Allergen Reactions  . Sulfa Antibiotics Rash    Other reaction(s): RASH  . Sulfacetamide Sodium Rash    Patient Measurements: Height: 6' (182.9 cm) Weight: 194 lb (87.998 kg) IBW/kg (Calculated) : 77.6 Heparin Dosing Weight: n/a  Vital Signs: Temp: 99.8 F (37.7 C) (07/14 0516) Temp Source: Oral (07/14 0516) BP: 127/46 mmHg (07/14 0516) Pulse Rate: 58 (07/14 0516)  Labs:  Recent Labs  08/03/15 2047 08/04/15 0548 08/05/15 0354  HGB 13.2 12.4*  --   HCT 39.0* 36.3*  --   PLT 207 184  --   LABPROT 25.2*  --  23.5*  INR 2.32  --  2.11  CREATININE 1.15 0.90  --   TROPONINI 0.03*  --   --     Estimated Creatinine Clearance: 52.7 mL/min (by C-G formula based on Cr of 0.9).   Medical History: Past Medical History  Diagnosis Date  . A-fib ([place])   . Acute renal failure ([place])   . DVT (deep venous thrombosis) ([place])   . Hypokalemia   . Hypertension   . Prostate cancer ([place])   . Stroke ([place])   . CAD (coronary artery disease)   . Hyperlipidemia   . Arthritis   . Pulmonary embolism ([place])   . Fall at home   . Rectal incontinence   . OAB (overactive bladder)   . Arrhythmia   . Nocturia   . Urge incontinence   . Neoplasm of uncertain behavior of kidney   . Acute cystitis   . Incomplete bladder emptying   . Rosacea   . Peripheral neuropathy ([place])   . DJD (degenerative joint disease)   . Vertigo   . Shingles   . CVD (cardiovascular disease)     Medications:    Assessment: INR 2.11 Goal of Therapy:  INR 2-3    Plan:  Continue home regimen of 4 mg M,W,F and 5 mg T,T,S,S. Daily INRs ordered while patient is on antibiotics.  Melissa D Maccia 08/05/2015,7:53 AM

## 2015-08-05 NOTE — Consult Note (Addendum)
Referring Physician: Ether Griffins    Chief Complaint: Weakness and decreased alertness  HPI: Samuel Wagner is an 82 y.o. male with a history of atrial fibrillation who by report of son was at his baseline on Monday.  Patient lives alone and manages all ADL's.  On Tuesday when the patient was visited he was starring into space at times but was able to still cook a little dinner and clean up his dishes.  On Wednesday patient was weaker and had difficulty ambulating and transferring.  Patient declined to the point that he was requiring to be carried.  With progressive decline patient was brought for evaluation.  Patient admitted for UTI.  Continued to go downhill with questionable hallucinations and decreased responsiveness.  MRI of the brain performed at that time.  Initial NIHSS of 21.   Patient on Coumadin with therapeutic INR.  Date last known well: 08/01/2015 Time last known well: Time: 18:00 tPA Given: No: Outside time window, On Coumadin  Past Medical History  Diagnosis Date  . A-fib ([place])   . Acute renal failure ([place])   . DVT (deep venous thrombosis) ([place])   . Hypokalemia   . Hypertension   . Prostate cancer ([place])   . Stroke ([place])   . CAD (coronary artery disease)   . Hyperlipidemia   . Arthritis   . Pulmonary embolism ([place])   . Fall at home   . Rectal incontinence   . OAB (overactive bladder)   . Arrhythmia   . Nocturia   . Urge incontinence   . Neoplasm of uncertain behavior of kidney   . Acute cystitis   . Incomplete bladder emptying   . Rosacea   . Peripheral neuropathy ([place])   . DJD (degenerative joint disease)   . Vertigo   . Shingles   . CVD (cardiovascular disease)     Past Surgical History  Procedure Laterality Date  . Appendectomy    . Cholecystectomy    . Hernia repair    . Right eye surgery    . Nasal sinus surgery    . Cataract extraction Bilateral 1995    Family History  Problem Relation Age of Onset  . Stroke Mother   . Stroke Father   .  Cancer Father     Prostate  . Cancer Son     prostate   Social History:  reports that he has quit smoking. He does not have any smokeless tobacco history on file. He reports that he does not drink alcohol or use illicit drugs.  Allergies:  Allergies  Allergen Reactions  . Sulfa Antibiotics Rash    Other reaction(s): RASH  . Sulfacetamide Sodium Rash    Medications:  I have reviewed the patient's current medications. Prior to Admission:  Prescriptions prior to admission  Medication Sig Dispense Refill Last Dose  . aspirin 81 MG tablet Take 81 mg by mouth.   08/03/2015 at 0800  . digoxin (LANOXIN) 0.25 MG tablet Take 0.25 mg by mouth daily.   08/03/2015 at Unknown time  . metoprolol tartrate (LOPRESSOR) 25 MG tablet TAKE 1 TABLET (25 MG TOTAL) BY MOUTH 2 (TWO) TIMES DAILY.  5 08/03/2015 at Unknown time  . Multiple Vitamins-Minerals (ICAPS AREDS 2) CAPS Take 2 capsules by mouth 2 (two) times daily.    08/03/2015 at Unknown time  . pantoprazole (PROTONIX) 40 MG tablet Take 40 mg by mouth daily.   08/03/2015 at Unknown time  . traMADol (ULTRAM) 50 MG tablet Take 50 mg by  mouth every 4 (four) hours as needed.   08/03/2015 at 1200  . warfarin (COUMADIN) 4 MG tablet Take 4 mg by mouth daily.  11 Past Week at Unknown time  . warfarin (COUMADIN) 5 MG tablet TAKE 1 TABLET (5 MG TOTAL) BY MOUTH ONCE DAILY.  5 08/02/2015 at Unknown time   Scheduled: . aspirin  324 mg Oral Daily  . aspirin  300 mg Rectal Daily  . atorvastatin  40 mg Oral q1800  . cefTRIAXone (ROCEPHIN)  IV  2 g Intravenous Q24H  . digoxin  0.25 mg Oral Daily  . metoprolol tartrate  12.5 mg Oral BID  . pantoprazole  40 mg Oral Daily  . sodium chloride flush  3 mL Intravenous Q12H  . warfarin  4 mg Oral Once per day on Mon Wed Fri  . warfarin  5 mg Oral Once per day on Sun Tue Thu Sat    ROS: History obtained from son  General ROS: negative for - chills, fatigue, fever, night sweats, weight gain or weight loss Psychological  ROS: negative for - behavioral disorder, hallucinations, memory difficulties, mood swings or suicidal ideation Ophthalmic ROS: negative for - blurry vision, double vision, eye pain or loss of vision ENT ROS: negative for - epistaxis, nasal discharge, oral lesions, sore throat, tinnitus or vertigo Allergy and Immunology ROS: negative for - hives or itchy/watery eyes Hematological and Lymphatic ROS: negative for - bleeding problems, bruising or swollen lymph nodes Endocrine ROS: negative for - galactorrhea, hair pattern changes, polydipsia/polyuria or temperature intolerance Respiratory ROS: negative for - cough, hemoptysis, shortness of breath or wheezing Cardiovascular ROS: negative for - chest pain, dyspnea on exertion, edema or irregular heartbeat Gastrointestinal ROS: negative for - abdominal pain, diarrhea, hematemesis, nausea/vomiting or stool incontinence Genito-Urinary ROS: negative for - dysuria, hematuria, incontinence or urinary frequency/urgency Musculoskeletal ROS: knee pain Neurological ROS: as noted in HPI Dermatological ROS: negative for rash and skin lesion changes  Physical Examination: Blood pressure 127/46, pulse 58, temperature 99.8 F (37.7 C), temperature source Oral, resp. rate 20, height 6' (1.829 m), weight 87.998 kg (194 lb), SpO2 95 %.  HEENT-  Normocephalic, no lesions, without obvious abnormality.  Normal external eye and conjunctiva.  Normal TM's bilaterally.  Normal auditory canals and external ears. Normal external nose, mucus membranes and septum.  Normal pharynx. Cardiovascular- S1, S2 normal, pulses palpable throughout   Lungs- chest clear, no wheezing, rales, normal symmetric air entry Abdomen- soft, non-tender; bowel sounds normal; no masses,  no organomegaly Extremities- BLE edema Lymph-no adenopathy palpable Musculoskeletal-no joint tenderness, deformity or swelling Skin-warm and dry, no hyperpigmentation, vitiligo, or suspicious lesions  Neurological  Examination Mental Status: Lethargic.  Alerts with light sternal rub.  Able to give his age and follow simple commands.  Speech slurred.   Cranial Nerves: II: Discs flat bilaterally; Blinks to bilateral confrontation, pupils equal, round, reactive to light and accommodation III,IV, VI: ptosis not present, Esotropia of right eye.  Patient unable to go beyond midline to the left with the right eye voluntarily V,VII: left facial droop, facial light touch sensation normal bilaterally VIII: hearing normal bilaterally IX,X: gag reflex reduced XI: unable to test XII: midline tongue extension Motor: Moves both upper extremities off the bed against gravity although weakly.  Spontaneously moves the left more than the right.  Minimal movement noted of the lower extremities with patient unable to lift either off the bed. Sensory: Patient responds to light tactile stimulation in all extremities.   Deep Tendon Reflexes:  2+ in the upper extremities, trace at the knees and absent at the ankles.   Plantars: Right: mute   Left: mute Cerebellar: Unable to perform due to lethargy Gait: Unable to perform due to lethargy and safety concerns    Laboratory Studies:  Basic Metabolic Panel:  Recent Labs Lab 08/03/15 2047 08/04/15 0548  NA 138 137  K 4.0 3.7  CL 104 105  CO2 23 24  GLUCOSE 140* 153*  BUN 26* 19  CREATININE 1.15 0.90  CALCIUM 9.2 8.5*    Liver Function Tests:  Recent Labs Lab 08/03/15 2047  AST 20  ALT 11*  ALKPHOS 53  BILITOT 0.8  PROT 6.6  ALBUMIN 4.0   No results for input(s): LIPASE, AMYLASE in the last 168 hours. No results for input(s): AMMONIA in the last 168 hours.  CBC:  Recent Labs Lab 08/03/15 2047 08/04/15 0548  WBC 10.5 11.2*  NEUTROABS 7.3*  --   HGB 13.2 12.4*  HCT 39.0* 36.3*  MCV 90.5 90.9  PLT 207 184    Cardiac Enzymes:  Recent Labs Lab 08/03/15 2047  TROPONINI 0.03*    BNP: Invalid input(s): POCBNP  CBG: No results for input(s):  GLUCAP in the last 168 hours.  Microbiology: Results for orders placed or performed during the hospital encounter of 08/03/15  Culture, blood (Routine X 2) w Reflex to ID Panel     Status: None (Preliminary result)   Collection Time: 08/03/15  9:12 PM  Result Value Ref Range Status   Specimen Description BLOOD R A  Final   Special Requests BOTTLES DRAWN AEROBIC AND ANAEROBIC 6CC  Final   Culture NO GROWTH 2 DAYS  Final   Report Status PENDING  Incomplete  Culture, blood (Routine X 2) w Reflex to ID Panel     Status: None (Preliminary result)   Collection Time: 08/03/15  9:17 PM  Result Value Ref Range Status   Specimen Description BLOOD L AC  Final   Special Requests BOTTLES DRAWN AEROBIC AND ANAEROBIC 6CC  Final   Culture NO GROWTH 2 DAYS  Final   Report Status PENDING  Incomplete    Coagulation Studies:  Recent Labs  08/03/15 2047 08/05/15 0354  LABPROT 25.2* 23.5*  INR 2.32 2.11    Urinalysis:  Recent Labs Lab 08/03/15 2123  COLORURINE YELLOW*  LABSPEC 1.014  PHURINE 5.0  GLUCOSEU NEGATIVE  HGBUR NEGATIVE  BILIRUBINUR NEGATIVE  KETONESUR NEGATIVE  PROTEINUR NEGATIVE  NITRITE POSITIVE*  LEUKOCYTESUR 2+*    Lipid Panel: No results found for: CHOL, TRIG, HDL, CHOLHDL, VLDL, LDLCALC  HgbA1C: No results found for: HGBA1C  Urine Drug Screen:  No results found for: LABOPIA, COCAINSCRNUR, LABBENZ, AMPHETMU, THCU, LABBARB  Alcohol Level: No results for input(s): ETH in the last 168 hours.  Other results: EKG: RBBB.  Imaging: Dg Chest 2 View  08/03/2015  CLINICAL DATA:  Generalize weakness for couple days. Difficulty walking. Fever. 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 No change in appearance since the previous chest radiograph. Heart size is normal. Calcification of the aorta. Linear atelectasis in the lung bases. No focal consolidation. No  blunting of costophrenic angles. No pneumothorax. Degenerative changes in the spine and shoulders. IMPRESSION: Stable appearance of right cardiophrenic angle lesion corresponding to a cyst on prior CT. No evidence of active pulmonary disease. Aortic atherosclerosis. Electronically Signed   By: Lucienne Capers  M.D.   On: 08/03/2015 22:13   Mr Brain Wo Contrast  08/04/2015  CLINICAL DATA:  Weakness for 1-2 days. Urinary tract infection. Left leg weakness. EXAM: MRI HEAD WITHOUT CONTRAST TECHNIQUE: Multiplanar, multiecho pulse sequences of the brain and surrounding structures were obtained without intravenous contrast. COMPARISON:  Head CT 10/08/2013 FINDINGS: Brain Parenchyma: There is focal diffusion restriction within the right pons, measuring approximately 1.2 cm in maximum dimension. This associated with hyperintense T2 weighted signal. No evidence of acute hemorrhage. There is mild periventricular hyperintense T2 weighted signal for age. No mass lesion or midline shift. The major intracranial flow voids are preserved. Ventricles, Sulci and Extra-axial Spaces: No hydrocephalus or age advanced atrophy. No extra-axial collection. Paranasal Sinuses and Mastoids: No fluid levels or advanced mucosal thickening. Orbits: Bilateral lens replacements. Bones and Soft Tissues: The visualized skull base, calvarium and extracranial soft tissues are normal. IMPRESSION: Acute right pontine infarct. No associated hemorrhage or significant mass effect. Critical Value/emergent results were called by telephone at the time of interpretation on 08/04/2015 at 1:58 pm to Dr. Theodoro Grist , who verbally acknowledged these results. Electronically Signed   By: Ulyses Jarred M.D.   On: 08/04/2015 14:00    Assessment: 82 y.o. male presenting with functional decline over the past 2-3 days.  Patient with a UTI on lab work but MRI of the brain performed as well due to continued decline.  MRI of the brain personally reviewed and shows an  acute right pontine infarct, likely embolic.  Patient with a history of atrial fibrillation on Coumadin and therapeutic.  Although acute brain stem infarct may affect alertness.  UTI/fever may be contributing as well.     Stroke Risk Factors - atrial fibrillation, hyperlipidemia and hypertension  Plan: 1. HgbA1c, fasting lipid panel 2. PT consult, OT consult, Speech consult 3. Agree with continued treatment of UTI 4. Echocardiogram not indicated at this time.  Patient on Coumadin and therapeutic.   5. Carotid dopplers not indicated at this time since patient on Coumadin, therapeutic and not an interventional candidate.   6. Prophylactic therapy-Continue Coumadin with target INR 2-3.   7. NPO until RN stroke swallow screen 8. Telemetry monitoring 9. Frequent neuro checks   Alexis Goodell, MD Neurology [phone] 08/05/2015, 10:47 AM

## 2015-08-05 NOTE — Progress Notes (Signed)
      Diagnoses:  #1 acute pontine stroke with worsening mental status, dysphagia, generalized weakness #2 urinary tract infection due to Escherichia coli,  #3 chronic atrial fibrillation   Present:  Dr Leotis Shames for medical care. Mr. Samuel Wagner, Samuel Wagner    I met with Mr. Samuel Wagner, who is power of attorney for medical care for Mr. Samuel Wagner, discussed with him about his father's condition, diagnosis, treatment plan,  including keeping patient nothing by mouth, continue IV fluids, and medications to be administered rectally or intravenously, following his neuro status closely. Mr. Samuel Wagner will be persistent to speak on patient's behalf and he will make decisions regarding medical therapy in the future. All questions were answered, he voiced understanding.    Time spent 20 minutes

## 2015-08-05 NOTE — Care Management (Signed)
Admitted to [facility] with the diagnosis of urinary tract infection. Lives alone. Son is Antony Haste [phone]). Last seen Dr. Ola Spurr 08/03/15. Been seen at [facility] in the past. Mr. Queener took care of all basic activities of daily living himself prior to this admission. Son helps with errands.  Poor prognosis. NPO. Palliative consult. Neurology consult. Shelbie Ammons RN MSN CCM Care Management [phone]

## 2015-08-05 NOTE — Care Management Important Message (Signed)
Important Message  Patient Details  Name: Samuel Wagner MRN: [id number] Date of Birth: 09/30/1916   Medicare Important Message Given:  Yes    Juliann Pulse A Morad Tal 08/05/2015, 11:24 AM

## 2015-08-06 LAB — URINE CULTURE

## 2015-08-06 LAB — LIPID PANEL
CHOL/HDL RATIO: 3.9 ratio
CHOLESTEROL: 142 mg/dL (ref 0–200)
HDL: 36 mg/dL — ABNORMAL LOW (ref >40)
LDL Cholesterol: 84 mg/dL (ref 0–99)
TRIGLYCERIDES: 108 mg/dL (ref <150)
VLDL: 22 mg/dL (ref 0–40)

## 2015-08-06 LAB — HEMOGLOBIN A1C: Hgb A1c MFr Bld: 6.4 % — ABNORMAL HIGH (ref 4.0–6.0)

## 2015-08-06 LAB — PROTIME-INR
INR: 1.98
Prothrombin Time: 22.4 seconds — ABNORMAL HIGH (ref 11.4–15.0)

## 2015-08-06 MED ORDER — SENNA 8.6 MG PO TABS
1.0000 | ORAL_TABLET | Freq: Every day | ORAL | Status: DC | PRN
  Administered 2015-08-06 – 2015-08-07 (×2): 8.6 mg via ORAL
  Filled 2015-08-06 (×2): qty 1

## 2015-08-06 NOTE — Progress Notes (Addendum)
[facility] [place] NAME: Samuel Wagner    MR#:  [id number]  DATE OF BIRTH:  Jul 17, 1916  SUBJECTIVE:  CHIEF COMPLAINT:   Chief Complaint  Patient presents with  . Weakness  Patient is a 82 year old Caucasian male status post history significant for history of A. fib, DVT, hypokalemia, hypertension, stroke, coronary artery disease, hyperlipidemia, PE, who presents to the hospital with complaints of generalized weakness, worsening over the past 1-2 days. On arrival to the hospital patient was noted to have pyuria, admitted for urinary tract infection and associated weakness.   Patient is more awake today, sitting in chair.   Review of Systems  Unable to perform ROS: medical condition    VITAL SIGNS: Blood pressure 126/50, pulse 58, temperature 98.7 F (37.1 C), temperature source Oral, resp. rate 20, height 6' (1.829 m), weight 194 lb (87.998 kg), SpO2 98 %.  PHYSICAL EXAMINATION:   GENERAL:  82 y.o.-year-old patient lying in the bed with no acute distress, slow to respond, masklike face, unable to answer questions , all however, was able to follow some commands, moves upper extremities equally, minimal lower extremity movements, nonverbal.  EYES: Pupils equal, round, reactive to light and accommodation. No scleral icterus. Extraocular muscles intact.  HEENT: Head atraumatic, normocephalic. Oropharynx and nasopharynx clear.  NECK:  Supple, no jugular venous distention. No thyroid enlargement, no tenderness.  LUNGS: Normal breath sounds bilaterally, no wheezing, rales,rhonchi or crepitation. No use of accessory muscles of respiration.  CARDIOVASCULAR: S1, S2 normal. No murmurs, rubs, or gallops.  ABDOMEN: Soft, nontender, nondistended. Bowel sounds present. No organomegaly or mass.  EXTREMITIES: No pedal edema, cyanosis, or clubbing.  NEUROLOGIC: Cranial nerves II through XII are grossly intact. Muscle strength 4/5 in upper  extremities, 1 out of 5 in the lower extremities,  Sensation intact. Gait not checked.  PSYCHIATRIC: The patient is more awake,  followed commands. SKIN: No obvious rash, lesion, or ulcer.   ORDERS/RESULTS REVIEWED:   CBC  Recent Labs Lab 08/03/15 2047 08/04/15 0548  WBC 10.5 11.2*  HGB 13.2 12.4*  HCT 39.0* 36.3*  PLT 207 184  MCV 90.5 90.9  MCH 30.6 31.0  MCHC 33.8 34.2  RDW 15.2* 15.4*  LYMPHSABS 2.1  --   MONOABS 0.9  --   EOSABS 0.1  --   BASOSABS 0.0  --    ------------------------------------------------------------------------------------------------------------------  Chemistries   Recent Labs Lab 08/03/15 2047 08/04/15 0548  NA 138 137  K 4.0 3.7  CL 104 105  CO2 23 24  GLUCOSE 140* 153*  BUN 26* 19  CREATININE 1.15 0.90  CALCIUM 9.2 8.5*  AST 20  --   ALT 11*  --   ALKPHOS 53  --   BILITOT 0.8  --    ------------------------------------------------------------------------------------------------------------------ estimated creatinine clearance is 52.7 mL/min (by C-G formula based on Cr of 0.9). ------------------------------------------------------------------------------------------------------------------ No results for input(s): TSH, T4TOTAL, T3FREE, THYROIDAB in the last 72 hours.  Invalid input(s): FREET3  Cardiac Enzymes  Recent Labs Lab 08/03/15 2047  TROPONINI 0.03*   ------------------------------------------------------------------------------------------------------------------ Invalid input(s): POCBNP ---------------------------------------------------------------------------------------------------------------  RADIOLOGY: Mr Brain Wo Contrast  08/04/2015  CLINICAL DATA:  Weakness for 1-2 days. Urinary tract infection. Left leg weakness. EXAM: MRI HEAD WITHOUT CONTRAST TECHNIQUE: Multiplanar, multiecho pulse sequences of the brain and surrounding structures were obtained without intravenous contrast. COMPARISON:  Head CT  10/08/2013 FINDINGS: Brain Parenchyma: There is focal diffusion restriction within the right pons, measuring approximately 1.2 cm in maximum dimension.  This associated with hyperintense T2 weighted signal. No evidence of acute hemorrhage. There is mild periventricular hyperintense T2 weighted signal for age. No mass lesion or midline shift. The major intracranial flow voids are preserved. Ventricles, Sulci and Extra-axial Spaces: No hydrocephalus or age advanced atrophy. No extra-axial collection. Paranasal Sinuses and Mastoids: No fluid levels or advanced mucosal thickening. Orbits: Bilateral lens replacements. Bones and Soft Tissues: The visualized skull base, calvarium and extracranial soft tissues are normal. IMPRESSION: Acute right pontine infarct. No associated hemorrhage or significant mass effect. Critical Value/emergent results were called by telephone at the time of interpretation on 08/04/2015 at 1:58 pm to Dr. Theodoro Grist , who verbally acknowledged these results. Electronically Signed   By: Ulyses Jarred M.D.   On: 08/04/2015 14:00    EKG:  Orders placed or performed during the hospital encounter of 08/03/15  . EKG 12-Lead  . EKG 12-Lead    ASSESSMENT AND PLAN:  Principal Problem:   UTI (lower urinary tract infection) Active Problems:   Chronic a-fib (HCC)   Essential hypertension, benign   GERD (gastroesophageal reflux disease)   Generalized weakness #1. Acute pontine stroke with worsening mental status, left lower extremity weakness,    Neurologist consultation suggested that stroke is likely embolic, does not recommend echo, carotid ultrasound. Continue patient on aspirin and  Coumadin, following ProTime/INR closely.  hemoglobin A1c 6.6,  Speech study suggested dysphagia 1 diet. Started statin.  #2. Urinary tract infection due to Escherichia coli,  continue Rocephin, urinary cultures showed Escherichia coli, sensitive to rocephin, blood cultures are negative so far.  continue  IV fluids.  #3. Essential hypertension,  blood pressure is relatively low, patient's blood pressure medications were suspended.   #4. Atrial fibrillation, chronic, heart rate is well controlled,  Continue Coumadin, f/u INR, continue metoprolol as well as digoxin.  #5. Generalized weakness, physical therapy suggested SNF. #6 slurry speech,  Better.   Palliative care consult.  Management plans discussed with the patient, his son and they are in agreement.   DRUG ALLERGIES:  Allergies  Allergen Reactions  . Sulfa Antibiotics Rash    Other reaction(s): RASH  . Sulfacetamide Sodium Rash    CODE STATUS:     Code Status Orders        Start     Ordered   08/04/15 0931  Do not attempt resuscitation (DNR)   Continuous    Question Answer Comment  In the event of cardiac or respiratory ARREST Do not call a "code blue"   In the event of cardiac or respiratory ARREST Do not perform Intubation, CPR, defibrillation or ACLS   In the event of cardiac or respiratory ARREST Use medication by any route, position, wound care, and other measures to relive pain and suffering. May use oxygen, suction and manual treatment of airway obstruction as needed for comfort.      08/04/15 0930    Code Status History    Date Active Date Inactive Code Status Order ID Comments User Context   08/03/2015 11:56 PM 08/04/2015  9:30 AM Full Code [id number]  Lance Coon, MD ED    Advance Directive Documentation        Most Recent Value   Type of Advance Directive  Healthcare Power of Attorney, Living will, Out of facility DNR (pink MOST or yellow form)   Pre-existing out of facility DNR order (yellow form or pink MOST form)     "MOST" Form in Place?        TOTAL  TIME TAKING CARE OF THIS PATIENT: 43 minutes.    Demetrios Loll M.D on 08/06/2015 at 12:46 PM  Between 7am to 6pm - Pager - [phone]  After 6pm go to [email or web address] - password [email or web address] [facility]  [facility]  Office   [phone]  CC: Primary care physician; Leonel Ramsay, MD

## 2015-08-06 NOTE — Progress Notes (Addendum)
Alert, resting in between care, denies pain, IV fluids infusing, on IV antibiotics for UTI, fair appetite, son at bedside and assisting with meals, incontinent of urine, afebrile, stable vitals, afib rate controlled. Up to chair for approximately 45 minutes with physical therapy. Senokot given for constipation, no bm during shift.

## 2015-08-06 NOTE — Progress Notes (Signed)
ANTICOAGULATION CONSULT NOTE - Initial Consult  Pharmacy Consult for warfarin dosing Indication: atrial fibrillation  Allergies  Allergen Reactions  . Sulfa Antibiotics Rash    Other reaction(s): RASH  . Sulfacetamide Sodium Rash    Patient Measurements: Height: 6' (182.9 cm) Weight: 194 lb (87.998 kg) IBW/kg (Calculated) : 77.6 Heparin Dosing Weight: n/a  Vital Signs: Temp: 97.9 F (36.6 C) (07/15 0457) Temp Source: Oral (07/15 0457) BP: 146/61 mmHg (07/15 0457) Pulse Rate: 81 (07/15 0457)  Labs:  Recent Labs  08/03/15 2047 08/04/15 0548 08/05/15 0354 08/06/15 0625  HGB 13.2 12.4*  --   --   HCT 39.0* 36.3*  --   --   PLT 207 184  --   --   LABPROT 25.2*  --  23.5* 22.4*  INR 2.32  --  2.11 1.98  CREATININE 1.15 0.90  --   --   TROPONINI 0.03*  --   --   --     Estimated Creatinine Clearance: 52.7 mL/min (by C-G formula based on Cr of 0.9).   Medical History: Past Medical History  Diagnosis Date  . A-fib ([place])   . Acute renal failure ([place])   . DVT (deep venous thrombosis) ([place])   . Hypokalemia   . Hypertension   . Prostate cancer ([place])   . Stroke ([place])   . CAD (coronary artery disease)   . Hyperlipidemia   . Arthritis   . Pulmonary embolism ([place])   . Fall at home   . Rectal incontinence   . OAB (overactive bladder)   . Arrhythmia   . Nocturia   . Urge incontinence   . Neoplasm of uncertain behavior of kidney   . Acute cystitis   . Incomplete bladder emptying   . Rosacea   . Peripheral neuropathy ([place])   . DJD (degenerative joint disease)   . Vertigo   . Shingles   . CVD (cardiovascular disease)     Medications:    Assessment: INR 1.98  Goal of Therapy:  INR 2-3    Plan:  Continue home regimen of 4 mg M,W,F and 5 mg T,T,S,S. Daily INRs ordered while patient is on antibiotics.  Ulice Dash D 08/06/2015,8:25 AM

## 2015-08-06 NOTE — Evaluation (Signed)
Physical Therapy Evaluation Patient Details Name: Samuel Wagner MRN: [id number] DOB: 1916/04/15 Today's Date: 08/06/2015   History of Present Illness  82 y/o male here with gradual recent decline in function.  Found to have UTI as well as an acute pontine infarct.  Pt with history of considerable knee pain that has been limiting.  Clinical Impression  Pt shows good effort with PT exam, but is somewhat lethargic/confused and is unable to really do much regarding functional mobility.  He has significant arthritis in b/l knees (and in most joints in general), apparently he has been having a harder and harder time getting around but his limitations at this time are much more than his recent baseline. Pt is unable to get to standing even with heavy assist and ultimately he needs stand-pivot transfer to get to the recliner.  Pt will need a higher level of care on discharge with continued PT services.    Follow Up Recommendations SNF    Equipment Recommendations       Recommendations for Other Services       Precautions / Restrictions Precautions Precautions: Fall Restrictions Weight Bearing Restrictions: No      Mobility  Bed Mobility Overal bed mobility: Needs Assistance Bed Mobility: Supine to Sit     Supine to sit: Mod assist     General bed mobility comments: Pt shows good effort with getting to EOB and trying to get to sitting.  Unable to get to upright needing assist with LEs and getting trunk upright and maintaining balnace  Transfers Overall transfer level: Needs assistance Equipment used: Rolling walker (2 wheeled) Transfers: Sit to/from Stand Sit to Stand: Total assist;Max assist         General transfer comment: Unable to get to fully upright, pt trying to pull up on walker, unable to shift weight/hips forward and ultimately unable to get to fully upright despite heavy assist and cuing.   Ambulation/Gait             General Gait Details: not attempted,  unable to get to full standing  Stairs            Wheelchair Mobility    Modified Rankin (Stroke Patients Only)       Balance Overall balance assessment: Needs assistance   Sitting balance-Leahy Scale: Poor       Standing balance-Leahy Scale: Zero                               Pertinent Vitals/Pain Pain Assessment:  (reports chronic leg/knee pain, unrated)    Home Living Family/patient expects to be discharged to:: Skilled nursing facility Living Arrangements: Alone               Additional Comments: Pt uses walker, has 5 steps with rails to enter, bathroom grab bars and BSC    Prior Function Level of Independence: Independent with assistive device(s)         Comments: Pt only out of the house for MD appointments, but apparently was able to do all ADLs, etc w/o assist     Hand Dominance        Extremity/Trunk Assessment   Upper Extremity Assessment: Generalized weakness (Pt able to move UEs against gravity, roughly equal b/l)           Lower Extremity Assessment: Generalized weakness (R LE grossly 4-/5, L 3/5)         Communication  Communication:  (limited, simple responses)  Cognition Arousal/Alertness: Lethargic Behavior During Therapy: WFL for tasks assessed/performed Overall Cognitive Status: Impaired/Different from baseline Area of Impairment: Awareness;Attention;Orientation                    General Comments      Exercises        Assessment/Plan    PT Assessment Patient needs continued PT services  PT Diagnosis Difficulty walking;Generalized weakness   PT Problem List Decreased strength;Decreased activity tolerance;Decreased balance;Decreased mobility;Decreased coordination;Decreased cognition;Decreased knowledge of use of DME;Decreased safety awareness;Decreased range of motion;Pain  PT Treatment Interventions DME instruction;Gait training;Functional mobility training;Therapeutic activities;Stair  training;Balance training;Therapeutic exercise;Neuromuscular re-education;Cognitive remediation;Patient/family education   PT Goals (Current goals can be found in the Care Plan section) Acute Rehab PT Goals Patient Stated Goal: get some strength back PT Goal Formulation: With patient/family Time For Goal Achievement: 08/20/15 Potential to Achieve Goals: Fair    Frequency Min 2X/week   Barriers to discharge        Co-evaluation               End of Session Equipment Utilized During Treatment: Gait belt Activity Tolerance: Patient limited by lethargy;Patient limited by fatigue Patient left: with call bell/phone within reach;with chair alarm set           Time: [phone] PT Time Calculation (min) (ACUTE ONLY): 22 min   Charges:   PT Evaluation $PT Eval Low Complexity: 1 Procedure     PT G Codes:        Kreg Shropshire, DPT 08/06/2015, 10:54 AM

## 2015-08-06 NOTE — Progress Notes (Signed)
Chart reviewed, pt visited. Remains afebrile today. Pt alert sitting upright in chair. Son present and reported Pt. ate dinner and breakfast well. Tolerates nectar thick liquids without s/s of aspiration or complaints. Son reported he does occasionally give Pt a sip of thin water at the end of the meal but Pt noted that he does cough more. Encouraged continuing with nectar thick liquids for now with occasional ice chips if Pt complains of thirst to limit aspiration. Son agreed. Pt likes grits and scrambled eggs, will request for breakfast meals. ST to f/u 2-3 days with toleration of current diet and reassess with thin liquids as appropriate.

## 2015-08-07 LAB — CBC
HEMATOCRIT: 32.5 % — AB (ref 40.0–52.0)
Hemoglobin: 11.3 g/dL — ABNORMAL LOW (ref 13.0–18.0)
MCH: 31.6 pg (ref 26.0–34.0)
MCHC: 34.7 g/dL (ref 32.0–36.0)
MCV: 91.1 fL (ref 80.0–100.0)
Platelets: 204 10*3/uL (ref 150–440)
RBC: 3.57 MIL/uL — ABNORMAL LOW (ref 4.40–5.90)
RDW: 14.7 % — AB (ref 11.5–14.5)
WBC: 9.2 10*3/uL (ref 3.8–10.6)

## 2015-08-07 LAB — DIGOXIN LEVEL: Digoxin Level: 2 ng/mL (ref 0.8–2.0)

## 2015-08-07 LAB — GLUCOSE, CAPILLARY
GLUCOSE-CAPILLARY: 135 mg/dL — AB (ref 65–99)
GLUCOSE-CAPILLARY: 122 mg/dL — AB (ref 65–99)
GLUCOSE-CAPILLARY: 179 mg/dL — AB (ref 65–99)

## 2015-08-07 LAB — PROTIME-INR
INR: 1.81
PROTHROMBIN TIME: 20.9 s — AB (ref 11.4–15.0)

## 2015-08-07 MED ORDER — ASPIRIN EC 81 MG PO TBEC
81.0000 mg | DELAYED_RELEASE_TABLET | Freq: Every day | ORAL | Status: DC
  Administered 2015-08-08: 11:00:00 81 mg via ORAL
  Filled 2015-08-07: qty 1

## 2015-08-07 MED ORDER — WARFARIN SODIUM 5 MG PO TABS
5.0000 mg | ORAL_TABLET | Freq: Every day | ORAL | Status: DC
  Administered 2015-08-07: 18:00:00 5 mg via ORAL
  Filled 2015-08-07: qty 1

## 2015-08-07 MED ORDER — LACTULOSE 10 GM/15ML PO SOLN
20.0000 g | Freq: Every day | ORAL | Status: DC | PRN
  Administered 2015-08-07: 18:00:00 20 g via ORAL
  Filled 2015-08-07: qty 30

## 2015-08-07 MED ORDER — INSULIN ASPART 100 UNIT/ML ~~LOC~~ SOLN: 0.0000 [IU] | Freq: Every day | SUBCUTANEOUS | Status: DC

## 2015-08-07 MED ORDER — INSULIN ASPART 100 UNIT/ML ~~LOC~~ SOLN
0.0000 [IU] | Freq: Three times a day (TID) | SUBCUTANEOUS | Status: DC
  Administered 2015-08-07 (×2): 1 [IU] via SUBCUTANEOUS
  Administered 2015-08-08: 12:00:00 2 [IU] via SUBCUTANEOUS
  Filled 2015-08-07: qty 2
  Filled 2015-08-07 (×2): qty 1

## 2015-08-07 MED ORDER — ASPIRIN 325 MG PO TABS
325.0000 mg | ORAL_TABLET | Freq: Every day | ORAL | Status: DC
Start: 2015-08-07 — End: 2015-08-07
  Administered 2015-08-07: 325 mg via ORAL
  Filled 2015-08-07: qty 1

## 2015-08-07 MED ORDER — METOPROLOL TARTRATE 25 MG PO TABS
12.5000 mg | ORAL_TABLET | Freq: Two times a day (BID) | ORAL | Status: DC
  Administered 2015-08-07 – 2015-08-08 (×2): 12.5 mg via ORAL
  Filled 2015-08-07 (×2): qty 1

## 2015-08-07 NOTE — Progress Notes (Signed)
This nurse received call from central telemetry.  Pt's HR in the 30s unsubtained.  Pt HR now in 40s-50s.  Dr. Bridgett Larsson on floor and assessed pt.  Checking digoxin levels.  Pt resting comfortably at this time.  Denies pain.  Will continue to monitor.  Clarise Cruz, RN

## 2015-08-07 NOTE — Care Management Important Message (Signed)
Important Message  Patient Details  Name: JAQUARION SPOFFORD MRN: [id number] Date of Birth: [birth year]   Medicare Important Message Given:  Yes    Rhiana Morash A, RN 08/07/2015, 3:24 PM

## 2015-08-07 NOTE — Progress Notes (Signed)
Patients blood pressure elevated spoke to dr Marcille Blanco not a concern at this time

## 2015-08-07 NOTE — Progress Notes (Signed)
ANTICOAGULATION CONSULT NOTE - Initial Consult  Pharmacy Consult for warfarin dosing Indication: atrial fibrillation  Allergies  Allergen Reactions  . Sulfa Antibiotics Rash    Other reaction(s): RASH  . Sulfacetamide Sodium Rash    Patient Measurements: Height: 6' (182.9 cm) Weight: 194 lb (87.998 kg) IBW/kg (Calculated) : 77.6 Heparin Dosing Weight: n/a  Vital Signs: Temp: 98 F (36.7 C) (07/16 0433) Temp Source: Oral (07/16 0433) BP: 160/60 mmHg (07/16 0433) Pulse Rate: 86 (07/16 0433)  Labs:  Recent Labs  08/05/15 0354 08/06/15 0625 08/07/15 0505  HGB  --   --  11.3*  HCT  --   --  32.5*  PLT  --   --  204  LABPROT 23.5* 22.4* 20.9*  INR 2.11 1.98 1.81    Estimated Creatinine Clearance: 52.7 mL/min (by C-G formula based on Cr of 0.9).   Medical History: Past Medical History  Diagnosis Date  . A-fib ([place])   . Acute renal failure ([place])   . DVT (deep venous thrombosis) ([place])   . Hypokalemia   . Hypertension   . Prostate cancer ([place])   . Stroke ([place])   . CAD (coronary artery disease)   . Hyperlipidemia   . Arthritis   . Pulmonary embolism ([place])   . Fall at home   . Rectal incontinence   . OAB (overactive bladder)   . Arrhythmia   . Nocturia   . Urge incontinence   . Neoplasm of uncertain behavior of kidney   . Acute cystitis   . Incomplete bladder emptying   . Rosacea   . Peripheral neuropathy ([place])   . DJD (degenerative joint disease)   . Vertigo   . Shingles   . CVD (cardiovascular disease)     Medications:    Assessment: 7/12 INR: 2.32 7/13 INR: N/A, Coumadin 5 mg 7/14 INR: 2.11, Coumadin 4 mg 7/15 INR: 1.98, Coumadin 5 mg 7/16 INR: 1.81   Goal of Therapy:  INR 2-3    Plan:  INR remains slightly therapeutic. Of note, patient missed a dose of Coumadin 7/12 and remains on abx. Will change warfarin dosing to 5 mg daily for now and consider changing back to home regimen of 5 mg daily except 4 mg MWF once INR is at or approaching  goal.   Ulice Dash D 08/07/2015,7:58 AM

## 2015-08-07 NOTE — Progress Notes (Signed)
CSW met at bedside with patient, son and daughter-in-law. Presented bed offers. Accepted bed at [facility]. CSW informed Maudie Mercury- Admissions Coordinator at [facility] of above. Per MD patient looking to discharge tomorrow. CSW will continue to follow and assist.  Ernest Pine, MSW, [place], [place] Social Worker [phone]

## 2015-08-07 NOTE — Progress Notes (Signed)
[facility] [place] NAME: Samuel Wagner    MR#:  [id number]  DATE OF BIRTH:  October 30, 1916  SUBJECTIVE:  CHIEF COMPLAINT:   Chief Complaint  Patient presents with  . Weakness  Patient is a 82 year old Caucasian male status post history significant for history of A. fib, DVT, hypokalemia, hypertension, stroke, coronary artery disease, hyperlipidemia, PE, who presents to the hospital with complaints of generalized weakness, worsening over the past 1-2 days. On arrival to the hospital patient was noted to have pyuria, admitted for urinary tract infection and associated weakness.   Patient is alert and awake, but looks tired.   Review of Systems  Review of Systems  Constitutional: Negative for fever, chills, but has malaise/fatigue.  Eyes: Negative for blurred vision and double vision.  Respiratory: Negative for cough, shortness of breath and wheezing.  Cardiovascular: Negative for chest pain, palpitations, orthopnea and leg swelling.  Gastrointestinal: Negative for nausea, vomiting and abdominal pain.   Genitourinary: Negative for dysuria, urgency and frequency.  Musculoskeletal: Negative for back pain and joint pain.  Neurological: Negative for dizziness, tingling, tremors, speech change, focal weakness, seizures, loss of consciousness and headaches.    VITAL SIGNS: Blood pressure 108/51, pulse 48, temperature 98.4 F (36.9 C), temperature source Oral, resp. rate 18, height 6' (1.829 m), weight 194 lb (87.998 kg), SpO2 98 %.  PHYSICAL EXAMINATION:   GENERAL:  82 y.o.-year-old patient lying in the bed with no acute distress, slow to respond, masklike face. Answered questions slowly. EYES: Pupils equal, round, reactive to light and accommodation. No scleral icterus. Extraocular muscles intact.  HEENT: Head atraumatic, normocephalic. Oropharynx and nasopharynx clear.  NECK:  Supple, no jugular venous distention. No thyroid enlargement,  no tenderness.  LUNGS: Normal breath sounds bilaterally, no wheezing, rales,rhonchi or crepitation. No use of accessory muscles of respiration.  CARDIOVASCULAR: S1, S2 normal. No murmurs, rubs, or gallops.  ABDOMEN: Soft, nontender, nondistended. Bowel sounds present. No organomegaly or mass.  EXTREMITIES: No pedal edema, cyanosis, or clubbing.  NEUROLOGIC: Cranial nerves II through XII are grossly intact. Muscle strength 4/5 in upper extremities, 1 out of 5 in the lower extremities,  Sensation intact. Gait not checked.  PSYCHIATRIC: The patient is alert, awake, and oriented x 3. SKIN: No obvious rash, lesion, or ulcer.   ORDERS/RESULTS REVIEWED:   CBC  Recent Labs Lab 08/03/15 2047 08/04/15 0548 08/07/15 0505  WBC 10.5 11.2* 9.2  HGB 13.2 12.4* 11.3*  HCT 39.0* 36.3* 32.5*  PLT 207 184 204  MCV 90.5 90.9 91.1  MCH 30.6 31.0 31.6  MCHC 33.8 34.2 34.7  RDW 15.2* 15.4* 14.7*  LYMPHSABS 2.1  --   --   MONOABS 0.9  --   --   EOSABS 0.1  --   --   BASOSABS 0.0  --   --    ------------------------------------------------------------------------------------------------------------------  Chemistries   Recent Labs Lab 08/03/15 2047 08/04/15 0548  NA 138 137  K 4.0 3.7  CL 104 105  CO2 23 24  GLUCOSE 140* 153*  BUN 26* 19  CREATININE 1.15 0.90  CALCIUM 9.2 8.5*  AST 20  --   ALT 11*  --   ALKPHOS 53  --   BILITOT 0.8  --    ------------------------------------------------------------------------------------------------------------------ estimated creatinine clearance is 52.7 mL/min (by C-G formula based on Cr of 0.9). ------------------------------------------------------------------------------------------------------------------ No results for input(s): TSH, T4TOTAL, T3FREE, THYROIDAB in the last 72 hours.  Invalid input(s): FREET3  Cardiac Enzymes  Recent Labs Lab 08/03/15 2047  TROPONINI 0.03*    ------------------------------------------------------------------------------------------------------------------ Invalid input(s): POCBNP ---------------------------------------------------------------------------------------------------------------  RADIOLOGY: No results found.  EKG:  Orders placed or performed during the hospital encounter of 08/03/15  . EKG 12-Lead  . EKG 12-Lead    ASSESSMENT AND PLAN:  Principal Problem:   UTI (lower urinary tract infection) Active Problems:   Chronic a-fib (HCC)   Essential hypertension, benign   GERD (gastroesophageal reflux disease)   Generalized weakness #1. Acute pontine stroke with worsening mental status, left lower extremity weakness,    Neurologist consultation suggested that stroke is likely embolic, does not recommend echo, carotid ultrasound. Continue patient on aspirin and  Coumadin, following ProTime/INR closely.  Speech study suggested dysphagia 1 diet. Started statin.  #2. Urinary tract infection due to Escherichia coli,  continue Rocephin, urinary cultures showed Escherichia coli, sensitive to rocephin, blood cultures are negative so far.  continue IV fluids.  #3. Essential hypertension,  blood pressure is relatively low, patient's blood pressure medications were suspended.   #4. Atrial fibrillation, chronic, heart rate is well controlled,  Continue Coumadin, f/u INR, continue metoprolol as well as digoxin.  #5. Generalized weakness, physical therapy suggested SNF. #6 slurry speech,  Improved.  * DM. hemoglobin A1c 6.6. Sliding scale.  Palliative care consult.  Management plans discussed with the patient, his son and they are in agreement.   DRUG ALLERGIES:  Allergies  Allergen Reactions  . Sulfa Antibiotics Rash    Other reaction(s): RASH  . Sulfacetamide Sodium Rash    CODE STATUS:     Code Status Orders        Start     Ordered   08/04/15 0931  Do not attempt resuscitation (DNR)   Continuous     Question Answer Comment  In the event of cardiac or respiratory ARREST Do not call a "code blue"   In the event of cardiac or respiratory ARREST Do not perform Intubation, CPR, defibrillation or ACLS   In the event of cardiac or respiratory ARREST Use medication by any route, position, wound care, and other measures to relive pain and suffering. May use oxygen, suction and manual treatment of airway obstruction as needed for comfort.      08/04/15 0930    Code Status History    Date Active Date Inactive Code Status Order ID Comments User Context   08/03/2015 11:56 PM 08/04/2015  9:30 AM Full Code [id number]  Lance Coon, MD ED    Advance Directive Documentation        Most Recent Value   Type of Advance Directive  Healthcare Power of Attorney, Living will, Out of facility DNR (pink MOST or yellow form)   Pre-existing out of facility DNR order (yellow form or pink MOST form)     "MOST" Form in Place?        TOTAL TIME TAKING CARE OF THIS PATIENT: 37 minutes.    Demetrios Loll M.D on 08/07/2015 at 11:14 AM  Between 7am to 6pm - Pager - [phone]  After 6pm go to [email or web address] - password [email or web address] [facility]  [place] [facility]  Office  [phone]  CC: Primary care physician; Leonel Ramsay, MD

## 2015-08-08 ENCOUNTER — Encounter
Admission: RE | Admit: 2015-08-08 | Discharge: 2015-08-08 | Disposition: A | Discharge status: Home / Self Care | Payer: Medicare Other | Source: Ambulatory Visit | Attending: Internal Medicine | Admitting: Internal Medicine

## 2015-08-08 LAB — PROTIME-INR
INR: 1.88
PROTHROMBIN TIME: 21.5 s — AB (ref 11.4–15.0)

## 2015-08-08 LAB — GLUCOSE, CAPILLARY
GLUCOSE-CAPILLARY: 120 mg/dL — AB (ref 65–99)
GLUCOSE-CAPILLARY: 191 mg/dL — AB (ref 65–99)

## 2015-08-08 MED ORDER — ATORVASTATIN CALCIUM 40 MG PO TABS: 40.0000 mg | ORAL_TABLET | Freq: Every day | ORAL | Status: AC

## 2015-08-08 MED ORDER — SENNA 8.6 MG PO TABS: 1.0000 | ORAL_TABLET | Freq: Every day | ORAL | Status: AC | PRN

## 2015-08-08 MED ORDER — METOPROLOL TARTRATE 25 MG PO TABS: 12.5000 mg | ORAL_TABLET | Freq: Two times a day (BID) | ORAL | Status: AC

## 2015-08-08 MED ORDER — ONDANSETRON HCL 4 MG PO TABS: 4.0000 mg | ORAL_TABLET | Freq: Four times a day (QID) | ORAL | Status: DC | PRN

## 2015-08-08 NOTE — Clinical Social Work Placement (Signed)
   CLINICAL SOCIAL WORK PLACEMENT  NOTE  Date:  08/08/2015  Patient Details  Name: Samuel Wagner MRN: [id number] Date of Birth: 11-15-1916  Clinical Social Work is seeking post-discharge placement for this patient at the [place] level of care (*CSW will initial, date and re-position this form in  chart as items are completed):  Yes   Patient/family provided with [place] Work Department's list of facilities offering this level of care within the geographic area requested by the patient (or if unable, by the patient's family).  Yes   Patient/family informed of their freedom to choose among providers that offer the needed level of care, that participate in Medicare, Medicaid or managed care program needed by the patient, have an available bed and are willing to accept the patient.  Yes   Patient/family informed of [facility]'s ownership interest in [facility] and [facility], as well as of the fact that they are under no obligation to receive care at these facilities.  PASRR submitted to EDS on       PASRR number received on       Existing PASRR number confirmed on 08/04/15     FL2 transmitted to all facilities in geographic area requested by pt/family on 08/04/15     FL2 transmitted to all facilities within larger geographic area on       Patient informed that his/her managed care company has contracts with or will negotiate with certain facilities, including the following:        Yes   Patient/family informed of bed offers received.  Patient chooses bed at  [facility])     Physician recommends and patient chooses bed at      Patient to be transferred to  ([facility]) on 08/08/15.  Patient to be transferred to facility by [facility]     Patient family notified on 08/08/15 of transfer.  Name of family member notified:  Pt's son     PHYSICIAN       Additional Comment:     _______________________________________________ Darden Dates, LCSW 08/08/2015, 2:55 PM

## 2015-08-08 NOTE — Progress Notes (Signed)
ANTICOAGULATION CONSULT NOTE - Initial Consult  Pharmacy Consult for warfarin dosing Indication: atrial fibrillation  Allergies  Allergen Reactions  . Sulfa Antibiotics Rash    Other reaction(s): RASH  . Sulfacetamide Sodium Rash   Patient Measurements: Height: 6' (182.9 cm) Weight: 194 lb (87.998 kg) IBW/kg (Calculated) : 77.6  Vital Signs: Temp: 98.2 F (36.8 C) (07/17 0445) Temp Source: Oral (07/17 0445) BP: 137/58 mmHg (07/17 0445) Pulse Rate: 89 (07/17 0445)  Labs:  Recent Labs  08/06/15 0625 08/07/15 0505 08/08/15 0614  HGB  --  11.3*  --   HCT  --  32.5*  --   PLT  --  204  --   LABPROT 22.4* 20.9* 21.5*  INR 1.98 1.81 1.88   Estimated Creatinine Clearance: 52.7 mL/min (by C-G formula based on Cr of 0.9).  Medical History: Past Medical History  Diagnosis Date  . A-fib ([place])   . Acute renal failure ([place])   . DVT (deep venous thrombosis) ([place])   . Hypokalemia   . Hypertension   . Prostate cancer ([place])   . Stroke ([place])   . CAD (coronary artery disease)   . Hyperlipidemia   . Arthritis   . Pulmonary embolism ([place])   . Fall at home   . Rectal incontinence   . OAB (overactive bladder)   . Arrhythmia   . Nocturia   . Urge incontinence   . Neoplasm of uncertain behavior of kidney   . Acute cystitis   . Incomplete bladder emptying   . Rosacea   . Peripheral neuropathy ([place])   . DJD (degenerative joint disease)   . Vertigo   . Shingles   . CVD (cardiovascular disease)     Assessment: Pharmacy consulted to dose warfarin in this 82 year old male who was taking warfarin prior to admission for atrial fibrillation. Patient's home regimen is: warfarin 5 mg PO daily except 4 mg on MWF.   7/12 INR: 2.32 7/13 INR: N/A, warfarin 5 mg 7/14 INR: 2.11, warfarin 4 mg 7/15 INR: 1.98, warfarin 5 mg 7/16 INR: 1.81, warfarin 5 mg 7/17 INR 1.88, warfarin 5 mg  Goal of Therapy:  INR 2-3    Plan:  INR remains slightly subtherapeutic but has increased. Of  note, patient missed a dose of Coumadin 7/12 and remains on abx. Will continue with warfarin 5 mg PO Daily for now and consider changing back to home regimen of 5 mg daily except 4 mg MWF once INR is at or approaching goal.   Lenis Noon, PharmD, BCPS 08/08/2015,8:52 AM

## 2015-08-08 NOTE — Progress Notes (Signed)
ST performed chart review and understand that pt is discharging to [facility]. ST notes from weekend indicate that pt continues to cough with trials of thin liquids. Son at bedside and states that he has given his father brief trials of thins yesterday with coughing present. He also gave pt 1 ice chip at a time without coughing. Education provided to continue honey thick liquids with puree, following aspiration precautions. Son voiced understanding. Anticipate discharge today.

## 2015-08-08 NOTE — Progress Notes (Signed)
Report called to [facility].  IVs removed.  Son at bedside.  Pt transported via EMS.  Clarise Cruz, RN

## 2015-08-08 NOTE — Discharge Summary (Signed)
[facility] [place] NAME: Samuel Wagner    MR#:  [id number]  DATE OF BIRTH:  Aug 25, 1916  DATE OF ADMISSION:  08/03/2015 ADMITTING PHYSICIAN: Lance Coon, MD  DATE OF DISCHARGE: 08/08/2015  PRIMARY CARE PHYSICIAN: Leonel Ramsay, MD    ADMISSION DIAGNOSIS:  Acute urinary tract infection [N39.0]  DISCHARGE DIAGNOSIS:  Principal Problem:   UTI (lower urinary tract infection) Active Problems:   Chronic a-fib (HCC)   Essential hypertension, benign   GERD (gastroesophageal reflux disease)   Generalized weakness  Acute CVA DM. SECONDARY DIAGNOSIS:   Past Medical History  Diagnosis Date  . A-fib ([place])   . Acute renal failure ([place])   . DVT (deep venous thrombosis) ([place])   . Hypokalemia   . Hypertension   . Prostate cancer ([place])   . Stroke ([place])   . CAD (coronary artery disease)   . Hyperlipidemia   . Arthritis   . Pulmonary embolism ([place])   . Fall at home   . Rectal incontinence   . OAB (overactive bladder)   . Arrhythmia   . Nocturia   . Urge incontinence   . Neoplasm of uncertain behavior of kidney   . Acute cystitis   . Incomplete bladder emptying   . Rosacea   . Peripheral neuropathy ([place])   . DJD (degenerative joint disease)   . Vertigo   . Shingles   . CVD (cardiovascular disease)     HOSPITAL COURSE:   #1. Acute pontine stroke with worsening mental status, left lower extremity weakness,   Neurologist consultation suggested that stroke is likely embolic, does not recommend echo, carotid ultrasound. Continue patient on aspirin and Coumadin, following ProTime/INR closely.  Speech study suggested dysphagia 1 diet. Started statin.  #2. Urinary tract infection due to Escherichia coli,  Treated wtih Rocephin, urinary cultures showed Escherichia coli, sensitive to rocephin, blood cultures are negative so far.  #3. Essential hypertension, blood pressure is relatively low, decreased lopressor to  12.5 mg bid.  #4. Atrial fibrillation, chronic, heart rate is well controlled,  Continue Coumadin, f/u INR, continue metoprolol as well as digoxin. Digoxin was discontinued yesterday due to bradycardia and elevated level. Bradycardia improved. Resume home dose after discharge.  #5. Generalized weakness, physical therapy suggested SNF. #6 slurry speech, Improved.  * DM. hemoglobin A1c 6.6. Sliding scale. ADA diet.  Palliative care consult.  DISCHARGE CONDITIONS:   Stable, discharge to SNF today.  CONSULTS OBTAINED:  Treatment Team:  Alexis Goodell, MD  DRUG ALLERGIES:   Allergies  Allergen Reactions  . Sulfa Antibiotics Rash    Other reaction(s): RASH  . Sulfacetamide Sodium Rash    DISCHARGE MEDICATIONS:   Current Discharge Medication List    START taking these medications   Details  atorvastatin (LIPITOR) 40 MG tablet Take 1 tablet (40 mg total) by mouth daily at 6 PM.    ondansetron (ZOFRAN) 4 MG tablet Take 1 tablet (4 mg total) by mouth every 6 (six) hours as needed for nausea. Qty: 20 tablet, Refills: 0    senna (SENOKOT) 8.6 MG TABS tablet Take 1 tablet (8.6 mg total) by mouth daily as needed for mild constipation. Qty: 30 each, Refills: 0      CONTINUE these medications which have CHANGED   Details  metoprolol tartrate (LOPRESSOR) 25 MG tablet Take 0.5 tablets (12.5 mg total) by mouth 2 (two) times daily.      CONTINUE these medications which have NOT CHANGED  Details  aspirin 81 MG tablet Take 81 mg by mouth.    digoxin (LANOXIN) 0.25 MG tablet Take 0.25 mg by mouth daily.    Multiple Vitamins-Minerals (ICAPS AREDS 2) CAPS Take 2 capsules by mouth 2 (two) times daily.     pantoprazole (PROTONIX) 40 MG tablet Take 40 mg by mouth daily.    !! warfarin (COUMADIN) 4 MG tablet Take 4 mg by mouth daily. Refills: 11    !! warfarin (COUMADIN) 5 MG tablet TAKE 1 TABLET (5 MG TOTAL) BY MOUTH ONCE DAILY. Refills: 5     !! - Potential duplicate  medications found. Please discuss with provider.    STOP taking these medications     traMADol (ULTRAM) 50 MG tablet          DISCHARGE INSTRUCTIONS:    DIET:  Dysphagia 1 diet.  DISCHARGE CONDITION:  Stable.  ACTIVITY:  As tolerated.  DISCHARGE LOCATION:    If you experience worsening of your admission symptoms, develop shortness of breath, life threatening emergency, suicidal or homicidal thoughts you must seek medical attention immediately by calling 911 or calling your MD immediately  if symptoms less severe.  You Must read complete instructions/literature along with all the possible adverse reactions/side effects for all the Medicines you take and that have been prescribed to you. Take any new Medicines after you have completely understood and accpet all the possible adverse reactions/side effects.   Please note  You were cared for by a hospitalist during your hospital stay. If you have any questions about your discharge medications or the care you received while you were in the hospital after you are discharged, you can call the unit and asked to speak with the hospitalist on call if the hospitalist that took care of you is not available. Once you are discharged, your primary care physician will handle any further medical issues. Please note that NO REFILLS for any discharge medications will be authorized once you are discharged, as it is imperative that you return to your primary care physician (or establish a relationship with a primary care physician if you do not have one) for your aftercare needs so that they can reassess your need for medications and monitor your lab values.    On the day of Discharge:  VITAL SIGNS:  Blood pressure 137/58, pulse 89, temperature 98.2 F (36.8 C), temperature source Oral, resp. rate 18, height 6' (1.829 m), weight 194 lb (87.998 kg), SpO2 97 %.  PHYSICAL EXAMINATION:  GENERAL:  82 y.o.-year-old patient lying in the bed with no acute  distress.  EYES: Pupils equal, round, reactive to light and accommodation. No scleral icterus. Extraocular muscles intact.  HEENT: Head atraumatic, normocephalic.  NECK:  Supple, no jugular venous distention. No thyroid enlargement, no tenderness.  LUNGS: Normal breath sounds bilaterally, no wheezing, rales,rhonchi or crepitation. No use of accessory muscles of respiration.  CARDIOVASCULAR: S1, S2 normal. No murmurs, rubs, or gallops.  ABDOMEN: Soft, non-tender, non-distended. Bowel sounds present. No organomegaly or mass.  EXTREMITIES: No pedal edema, cyanosis, or clubbing.  NEUROLOGIC: Cranial nerves II through XII are intact. Muscle strength 3-4/5 in all extremities. Sensation intact. Gait not checked.  PSYCHIATRIC: The patient is alert and oriented x 3. Slow response to questions. SKIN: No obvious rash, lesion, or ulcer.  DATA REVIEW:   CBC  Recent Labs Lab 08/07/15 0505  WBC 9.2  HGB 11.3*  HCT 32.5*  PLT 204    Chemistries   Recent Labs Lab 08/03/15 2047  08/04/15 0548  NA 138 137  K 4.0 3.7  CL 104 105  CO2 23 24  GLUCOSE 140* 153*  BUN 26* 19  CREATININE 1.15 0.90  CALCIUM 9.2 8.5*  AST 20  --   ALT 11*  --   ALKPHOS 53  --   BILITOT 0.8  --     Cardiac Enzymes  Recent Labs Lab 08/03/15 2047  TROPONINI 0.03*    Microbiology Results  Results for orders placed or performed during the hospital encounter of 08/03/15  Culture, blood (Routine X 2) w Reflex to ID Panel     Status: None (Preliminary result)   Collection Time: 08/03/15  9:12 PM  Result Value Ref Range Status   Specimen Description BLOOD R A  Final   Special Requests BOTTLES DRAWN AEROBIC AND ANAEROBIC 6CC  Final   Culture NO GROWTH 4 DAYS  Final   Report Status PENDING  Incomplete  Culture, blood (Routine X 2) w Reflex to ID Panel     Status: None (Preliminary result)   Collection Time: 08/03/15  9:17 PM  Result Value Ref Range Status   Specimen Description BLOOD L AC  Final   Special  Requests BOTTLES DRAWN AEROBIC AND ANAEROBIC 6CC  Final   Culture NO GROWTH 4 DAYS  Final   Report Status PENDING  Incomplete  Urine culture     Status: Abnormal   Collection Time: 08/03/15  9:23 PM  Result Value Ref Range Status   Specimen Description URINE, RANDOM  Final   Special Requests NONE  Final   Culture >=100,000 COLONIES/mL ESCHERICHIA COLI (A)  Final   Report Status 08/06/2015 FINAL  Final   Organism ID, Bacteria ESCHERICHIA COLI (A)  Final      Susceptibility   Escherichia coli - MIC*    AMPICILLIN >=32 RESISTANT Resistant     CEFAZOLIN 32 INTERMEDIATE Intermediate     CEFTRIAXONE <=1 SENSITIVE Sensitive     CIPROFLOXACIN <=0.25 SENSITIVE Sensitive     GENTAMICIN <=1 SENSITIVE Sensitive     IMIPENEM <=0.25 SENSITIVE Sensitive     NITROFURANTOIN <=16 SENSITIVE Sensitive     TRIMETH/SULFA <=20 SENSITIVE Sensitive     AMPICILLIN/SULBACTAM >=32 RESISTANT Resistant     PIP/TAZO 64 INTERMEDIATE Intermediate     Extended ESBL NEGATIVE Sensitive     * >=100,000 COLONIES/mL ESCHERICHIA COLI    RADIOLOGY:  No results found.   Management plans discussed with the patient, his son and they are in agreement.  CODE STATUS:     Code Status Orders        Start     Ordered   08/04/15 0931  Do not attempt resuscitation (DNR)   Continuous    Question Answer Comment  In the event of cardiac or respiratory ARREST Do not call a "code blue"   In the event of cardiac or respiratory ARREST Do not perform Intubation, CPR, defibrillation or ACLS   In the event of cardiac or respiratory ARREST Use medication by any route, position, wound care, and other measures to relive pain and suffering. May use oxygen, suction and manual treatment of airway obstruction as needed for comfort.      08/04/15 0930    Code Status History    Date Active Date Inactive Code Status Order ID Comments User Context   08/03/2015 11:56 PM 08/04/2015  9:30 AM Full Code [id number]  Lance Coon, MD ED      Advance Directive Documentation  Most Recent Value   Type of Advance Directive  Healthcare Power of Attorney, Living will, Out of facility DNR (pink MOST or yellow form)   Pre-existing out of facility DNR order (yellow form or pink MOST form)     "MOST" Form in Place?        TOTAL TIME TAKING CARE OF THIS PATIENT: 35 minutes.    Demetrios Loll M.D on 08/08/2015 at 8:37 AM  Between 7am to 6pm - Pager - [phone]  After 6pm go to [email or web address] - password [email or web address] [facility]  [facility]  Office  [phone]  CC: Primary care physician; FITZGERALD, DAVID Mamie Nick, MD   Note: This dictation was prepared with Dragon dictation along with smaller phrase technology. Any transcriptional errors that result from this process are unintentional.

## 2015-08-08 NOTE — Discharge Instructions (Signed)
Dysphagia 1 heart healthy and ADA diet. Activity as tolerated. Fall and aspiration precaution. Follow up INR. Palliative care consult in SNF.

## 2015-08-08 NOTE — Clinical Social Work Note (Signed)
Pt is ready for discharge today and will go [facility] Facility is able to accept pt as they have received discharge information. Pt's son is aware and agreeable to discharge plan. RN will call report and EMS will provide transportation. CSW is signing off as no further needs identified.   Darden Dates, MSW, LCSW  Clinical Social Worker  [phone]

## 2015-08-11 LAB — PROTIME-INR
INR: 1.96
Prothrombin Time: 22.2 seconds — ABNORMAL HIGH (ref 11.4–15.0)

## 2015-08-16 ENCOUNTER — Encounter: Payer: Self-pay | Admitting: Emergency Medicine

## 2015-08-16 ENCOUNTER — Emergency Department
Admission: EM | Admit: 2015-08-16 | Discharge: 2015-08-16 | Disposition: A | Discharge status: Home / Self Care | Payer: Medicare Other | Attending: Emergency Medicine | Admitting: Emergency Medicine

## 2015-08-16 ENCOUNTER — Emergency Department: Payer: Medicare Other

## 2015-08-16 DIAGNOSIS — Z515 Encounter for palliative care: Secondary | ICD-10-CM | POA: Diagnosis not present

## 2015-08-16 DIAGNOSIS — I639 Cerebral infarction, unspecified: Secondary | ICD-10-CM | POA: Diagnosis not present

## 2015-08-16 DIAGNOSIS — R4182 Altered mental status, unspecified: Secondary | ICD-10-CM | POA: Diagnosis present

## 2015-08-16 DIAGNOSIS — Z66 Do not resuscitate: Secondary | ICD-10-CM | POA: Insufficient documentation

## 2015-08-16 DIAGNOSIS — J690 Pneumonitis due to inhalation of food and vomit: Secondary | ICD-10-CM

## 2015-08-16 DIAGNOSIS — I2510 Atherosclerotic heart disease of native coronary artery without angina pectoris: Secondary | ICD-10-CM | POA: Insufficient documentation

## 2015-08-16 DIAGNOSIS — I63512 Cerebral infarction due to unspecified occlusion or stenosis of left middle cerebral artery: Secondary | ICD-10-CM | POA: Diagnosis not present

## 2015-08-16 DIAGNOSIS — I10 Essential (primary) hypertension: Secondary | ICD-10-CM | POA: Insufficient documentation

## 2015-08-16 LAB — URINALYSIS COMPLETE WITH MICROSCOPIC (ARMC ONLY)
BILIRUBIN URINE: NEGATIVE
Bacteria, UA: NONE SEEN
Glucose, UA: NEGATIVE mg/dL
HGB URINE DIPSTICK: NEGATIVE
Ketones, ur: NEGATIVE mg/dL
Nitrite: NEGATIVE
PH: 6 (ref 5.0–8.0)
Protein, ur: NEGATIVE mg/dL
SPECIFIC GRAVITY, URINE: 1.011 (ref 1.005–1.030)

## 2015-08-16 LAB — COMPREHENSIVE METABOLIC PANEL
ALK PHOS: 114 U/L (ref 38–126)
ALT: 44 U/L (ref 17–63)
ANION GAP: 9 (ref 5–15)
AST: 42 U/L — ABNORMAL HIGH (ref 15–41)
Albumin: 3.2 g/dL — ABNORMAL LOW (ref 3.5–5.0)
BILIRUBIN TOTAL: 1.1 mg/dL (ref 0.3–1.2)
BUN: 18 mg/dL (ref 6–20)
CALCIUM: 8.6 mg/dL — AB (ref 8.9–10.3)
CO2: 27 mmol/L (ref 22–32)
Chloride: 104 mmol/L (ref 101–111)
Creatinine, Ser: 0.93 mg/dL (ref 0.61–1.24)
Glucose, Bld: 124 mg/dL — ABNORMAL HIGH (ref 65–99)
Potassium: 4.3 mmol/L (ref 3.5–5.1)
SODIUM: 140 mmol/L (ref 135–145)
TOTAL PROTEIN: 6.4 g/dL — AB (ref 6.5–8.1)

## 2015-08-16 LAB — CBC WITH DIFFERENTIAL/PLATELET
Basophils Absolute: 0.2 10*3/uL — ABNORMAL HIGH (ref 0–0.1)
Basophils Relative: 1 %
EOS ABS: 0.3 10*3/uL (ref 0–0.7)
Eosinophils Relative: 2 %
HCT: 38.4 % — ABNORMAL LOW (ref 40.0–52.0)
HEMOGLOBIN: 13.1 g/dL (ref 13.0–18.0)
LYMPHS ABS: 2.9 10*3/uL (ref 1.0–3.6)
Lymphocytes Relative: 24 %
MCH: 31.3 pg (ref 26.0–34.0)
MCHC: 34.2 g/dL (ref 32.0–36.0)
MCV: 91.7 fL (ref 80.0–100.0)
Monocytes Absolute: 0.6 10*3/uL (ref 0.2–1.0)
Neutro Abs: 8.4 10*3/uL — ABNORMAL HIGH (ref 1.4–6.5)
Platelets: 368 10*3/uL (ref 150–440)
RBC: 4.19 MIL/uL — ABNORMAL LOW (ref 4.40–5.90)
RDW: 14.7 % — ABNORMAL HIGH (ref 11.5–14.5)
WBC: 12.3 10*3/uL — ABNORMAL HIGH (ref 3.8–10.6)

## 2015-08-16 LAB — TROPONIN I

## 2015-08-16 LAB — LACTIC ACID, PLASMA: LACTIC ACID, VENOUS: 1.5 mmol/L (ref 0.5–1.9)

## 2015-08-16 LAB — PROTIME-INR
INR: 2.29
Prothrombin Time: 25 seconds — ABNORMAL HIGH (ref 11.4–15.0)

## 2015-08-16 MED ORDER — SCOPOLAMINE 1 MG/3DAYS TD PT72
1.0000 | MEDICATED_PATCH | TRANSDERMAL | Status: DC
  Administered 2015-08-16: 1.5 mg via TRANSDERMAL
  Filled 2015-08-16: qty 1

## 2015-08-16 MED ORDER — MORPHINE SULFATE (PF) 2 MG/ML IV SOLN
1.0000 mg | INTRAVENOUS | Status: DC | PRN
  Administered 2015-08-16: 1 mg via INTRAVENOUS
  Filled 2015-08-16: qty 1

## 2015-08-16 MED ORDER — LORAZEPAM 2 MG/ML IJ SOLN
1.0000 mg | INTRAMUSCULAR | Status: DC | PRN
  Administered 2015-08-16: 1 mg via INTRAVENOUS
  Filled 2015-08-16: qty 1

## 2015-08-16 MED ORDER — SODIUM CHLORIDE 0.9 % IV BOLUS (SEPSIS)
1000.0000 mL | Freq: Once | INTRAVENOUS | Status: AC
Start: 2015-08-16 — End: 2015-08-16
  Administered 2015-08-16: 1000 mL via INTRAVENOUS

## 2015-08-16 MED ORDER — PIPERACILLIN-TAZOBACTAM 3.375 G IVPB 30 MIN
3.3750 g | Freq: Once | INTRAVENOUS | Status: AC
  Administered 2015-08-16: 3.375 g via INTRAVENOUS
  Filled 2015-08-16: qty 50

## 2015-08-16 MED ORDER — SODIUM CHLORIDE 0.9 % IV BOLUS (SEPSIS)
1000.0000 mL | Freq: Once | INTRAVENOUS | Status: AC
  Administered 2015-08-16: 1000 mL via INTRAVENOUS

## 2015-08-16 MED ORDER — VANCOMYCIN HCL IN DEXTROSE 1-5 GM/200ML-% IV SOLN
1000.0000 mg | Freq: Once | INTRAVENOUS | Status: AC
  Administered 2015-08-16: 1000 mg via INTRAVENOUS
  Filled 2015-08-16: qty 200

## 2015-08-16 NOTE — Clinical Social Work Note (Signed)
Clinical Social Work Assessment  Patient Details  Name: Samuel Wagner MRN: [id number] Date of Birth: 09-25-1916  Date of referral:  08/16/15               Reason for consult:  Facility Placement                Permission sought to share information with:  Facility Sport and exercise psychologist Permission granted to share information::     Name::      Laiden Kaniecki [phone] (son)  Agency::   [facility] [phone]  Relationship::     Contact Information:     Housing/Transportation Living arrangements for the past 2 months:  [place] of Information:    Patient Interpreter Needed:  None Criminal Activity/Legal Involvement Pertinent to Current Situation/Hospitalization:  No - Comment as needed Significant Relationships:  Adult Children, Community Support Lives with:  Facility Resident Do you feel safe going back to the place where you live?  Yes Need for family participation in patient care:  Yes (Comment) (Son participates in pt's care)  Care giving concerns: No care giving concerns addressed at this time.   Social Worker assessment / plan: Pt is currently inappropriate for assessment. CSW completed assessment with worker from [facility] [employer] Workers at [employer] place state that at baseline pt is able to walk and talk. Pt needs minimal assistance standing and is able to walk 50 ft. independently. Pt can also typically get in and out of bed with only supervision. Pt has a son who [facility] place workers describe as supportive and very involved in pt's care. Son comes to visit pt daily at the facility. Pt was previously married but his wife died several years ago. Pt can return to facility upon d/c. Pt will be admitted to the hospital. CSW will complete FL2 and keep facility updated on pt's condition.  Employment status:  Retired Forensic scientist:  Medicare PT Recommendations:  Not assessed at this time [place] / Referral to community resources:   [place]  Patient/Family's Response to care: Pt will return to facility upon d/c.  Patient/Family's Understanding of and Emotional Response to Diagnosis, Current Treatment, and Prognosis: Pt is appreciative of CSW's assistance at this time.  Emotional Assessment Appearance:  Appears older than stated age Attitude/Demeanor/Rapport:  Unable to Assess Affect (typically observed):  Unable to Assess Orientation:  Fluctuating Orientation (Suspected and/or reported Sundowners), Oriented to Self, Oriented to Place Alcohol / Substance use:  Not Applicable Psych involvement (Current and /or in the community):  No (Comment)  Discharge Needs  Concerns to be addressed:  No discharge needs identified Readmission within the last 30 days:  No Current discharge risk:  Dependent with Mobility Barriers to Discharge:  Continued Medical Work up   [employer], [place] 08/16/2015, 11:12 AM

## 2015-08-16 NOTE — ED Provider Notes (Signed)
[facility] Emergency Department Provider Note  Time seen: 7:09 AM  I have reviewed the triage vital signs and the nursing notes.   HISTORY  Chief Complaint Altered mental status   HPI Samuel Wagner is a 82 y.o. male with a past medical history of hyperlipidemia, hypertension, atrial fibrillation, gastric reflux who presents from his nursing facility for altered mental status.According to EMS reported they state the patient is normally alert and talkative however this morning he is very somnolent, with minimal response. He has very coarse breath sounds, crackles. Patient does have a DO NOT RESUSCITATE order. Patient is unable to contribute to the history.     Past Medical History:  Diagnosis Date  . A-fib ([place])   . Acute cystitis   . Acute renal failure ([place])   . Arrhythmia   . Arthritis   . CAD (coronary artery disease)   . CVD (cardiovascular disease)   . DJD (degenerative joint disease)   . DVT (deep venous thrombosis) ([place])   . Fall at home   . Hyperlipidemia   . Hypertension   . Hypokalemia   . Incomplete bladder emptying   . Neoplasm of uncertain behavior of kidney   . Nocturia   . OAB (overactive bladder)   . Peripheral neuropathy ([place])   . Prostate cancer ([place])   . Pulmonary embolism ([place])   . Rectal incontinence   . Rosacea   . Shingles   . Stroke ([place])   . Urge incontinence   . Vertigo     Patient Active Problem List   Diagnosis Date Noted  . Generalized weakness 08/03/2015  . UTI (lower urinary tract infection) 08/03/2015  . Arteriosclerosis of coronary artery 10/01/2014  . HLD (hyperlipidemia) 10/01/2014  . BP (high blood pressure) 10/01/2014  . Closed arm fracture 11/06/2013  . Glenoid fracture of shoulder 10/15/2013  . Chronic a-fib ([place]) January 08, 202015  . DVT (deep venous thrombosis) ([place]) January 08, 202015  . Late effects of CVA (cerebrovascular accident) January 08, 202015  . Chronic anticoagulation January 08, 202015  . Essential  hypertension, benign January 08, 202015  . E-coli UTI January 08, 202015  . GERD (gastroesophageal reflux disease) January 08, 202015  . Physical deconditioning January 08, 202015  . Arthritis of knee, degenerative 07/31/2013  . A-fib ([place]) 05/20/2013  . Acute cystitis 12/12/2011  . Incomplete bladder emptying 12/12/2011  . CA of prostate ([place]) 12/12/2011  . Flu vaccine need 12/12/2011  . 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 Excessive urination at night 12/12/2011  . Infection of urinary tract 12/12/2011  . Urge incontinence 12/12/2011    Past Surgical History:  Procedure Laterality Date  . APPENDECTOMY    . CATARACT EXTRACTION Bilateral 1995  . CHOLECYSTECTOMY    . HERNIA REPAIR    . NASAL SINUS SURGERY    . right eye surgery      Current Outpatient Rx  . Order #: [id number] Class: Historical Med  . Order #: [id number] Class: No Print  . Order #: [id number] Class: Historical Med  . Order #: [id number] Class: No Print  . Order #: [id number] Class: Historical Med  . Order #: [id number] Class: No Print  . Order #: [id number] Class: Historical Med  . Order #: [id number] Class: No Print  . Order #: [id number] Class: Historical Med  . Order #: [id number] Class: Historical Med    Allergies Sulfa antibiotics and Sulfacetamide sodium  Family History  Problem Relation Age of Onset  . Stroke Mother   . Stroke Father   . Cancer Father     Prostate  . Cancer Son  prostate    Social History Social History  Substance Use Topics  . Smoking status: Former Research scientist (life sciences)  . Smokeless tobacco: Not on file  . Alcohol use No    Review of Systems Unable to obtain a review of systems secondary to unresponsiveness/altered mental status  ____________________________________________   PHYSICAL EXAM:  Constitutional: Eyes are open, minimal response to sternal rub. Does not answer questions or follow commands. Eyes: Normal exam ENT   Head: Normocephalic and atraumatic.   Mouth/Throat: Mucous  membranes are moist. Cardiovascular: Normal rate, regular rhythm. No murmur Respiratory: Mild tachypnea with diffuse crackles in all lung fields. Gastrointestinal: Soft, no reaction to palpation, no distention. Musculoskeletal: No lower extremity edema Neurologic:  Patient unable to follow commands, speak or answer questions. Skin:  Skin is warm, dry and intact.  Psychiatric: Altered mental status  ____________________________________________    EKG  EKG reviewed and interpreted by myself shows atrial fibrillation at 78 bpm, slightly widened QRS, right axis deviation, nonspecific ST changes without ST elevation.  ____________________________________________    RADIOLOGY  CT scan of the head consistent with large MCA territory infarct. X-ray consistent with pneumonia.  ____________________________________________   INITIAL IMPRESSION / ASSESSMENT AND PLAN / ED COURSE  Pertinent labs & imaging results that were available during my care of the patient were reviewed by me and considered in my medical decision making (see chart for details).  Patient presents altered mental status from his nursing facility. Diffuse crackles on exam.  X-rays consistent with pneumonia. CT scan consistent with left MCA infarct, large in size. I activated sepsis protocols including antibiotics fluids. After discussing the patient with hospitalist we decided that the best course of action would likely be a palliative care consultation at this point. I discussed the patient with palliative care who has been down to see the patient, family wishes to do comfort measures/palliative care. We are currently working on getting the patient to a palliative care/hospice center such as hospice house. We have decreased are fluids. Family is agreeable to this plan.  Patient will be discharged from the emergency department and will proceed directly to hospice house via EMS for admission for comfort  care.  ____________________________________________   FINAL CLINICAL IMPRESSION(S) / ED DIAGNOSES  Cerebrovascular accident Aspiration pneumonia    Harvest Dark, MD 08/16/15 1226

## 2015-08-16 NOTE — ED Notes (Signed)
Pt to CT scan via stretcher.

## 2015-08-16 NOTE — Consult Note (Signed)
Consultation Note Date: 08/16/2015   Patient Name: Samuel Wagner  DOB: 1916-08-04  MRN: [id number]  Age / Sex: 82 y.o., male  PCP: Leonel Ramsay, MD Referring Physician: Harvest Dark, MD  Reason for Consultation: Establishing goals of care, Hospice Evaluation, Non pain symptom management, Pain control and Psychosocial/spiritual support  HPI/Patient Profile: 82 y.o. male   admitted on 08/16/2015  past medical history of hyperlipidemia, hypertension, atrial fibrillation, gastric reflux who presents from his nursing facility for altered mental status.  Diffuse crackles on exam.  X-rays consistent with pneumonia. CT scan consistent with left MCA infarct, large in size.   Conversation with family/son;  knowing poor overall prognosis desires shift to comfort and transition to hospice facility.   No further diagnostics, artificial feeding or hydration, minimize medication, maximize comfort measures.   Clinical Assessment and Goals of Care:   This NP Wadie Lessen reviewed medical records, received report from team, assessed the patient and then meet at the patient's bedside/ER along with his son  to discuss diagnosis prognosis, [place], EOL wishes disposition and options.   A detailed discussion was had today regarding advanced directives.  Concepts specific to code status, artifical feeding and hydration, continued IV antibiotics and rehospitalization was had.  The difference between a aggressive medical intervention path  and a palliative comfort care path for this patient at this time was had.  Values and goals of care important to patient and family were attempted to be elicited.  Concept of Hospice and Palliative Care were discussed  Natural trajectory and expectations at EOL were discussed.  Questions and concerns addressed.  Family encouraged to call with questions or concerns.  PMT will continue to  support holistically.    SUMMARY OF RECOMMENDATIONS    Code Status/Advance Care Planning:  DNR    Symptom Management:   Pain/Dyspnea:  Morphine 1 mg IV every 1 hr prn  Agitation: Ativan 1 mg IV every 4 hrs prn  Terminal secretions: Scopolamine Patch as directed  Palliative Prophylaxis:   Aspiration, Bowel Regimen, Frequent Pain Assessment and Oral Care  Additional Recommendations (Limitations, Scope, Preferences):  Full Comfort Care  Psycho-social/Spiritual:   Desire for further Chaplaincy support:no  Additional Recommendations: Education on Hospice  Prognosis:   < 2 weeks  Discharge Planning: Hospice facility      Primary Diagnoses: Present on Admission: **None**   I have reviewed the medical record, interviewed the patient and family, and examined the patient. The following aspects are pertinent.  Past Medical History:  Diagnosis Date  . A-fib ([place])   . Acute cystitis   . Acute renal failure ([place])   . Arrhythmia   . Arthritis   . CAD (coronary artery disease)   . CVD (cardiovascular disease)   . DJD (degenerative joint disease)   . DVT (deep venous thrombosis) ([place])   . Fall at home   . Hyperlipidemia   . Hypertension   . Hypokalemia   . Incomplete bladder emptying   . Neoplasm of uncertain behavior of kidney   .  Nocturia   . OAB (overactive bladder)   . Peripheral neuropathy ([place])   . Prostate cancer ([place])   . Pulmonary embolism ([place])   . Rectal incontinence   . Rosacea   . Shingles   . Stroke ([place])   . Urge incontinence   . Vertigo    Social History   Social History  . Marital status: Widowed    Spouse name: N/A  . Number of children: N/A  . Years of education: N/A   Social History Main Topics  . Smoking status: Former Research scientist (life sciences)  . Smokeless tobacco: None  . Alcohol use No  . Drug use: No  . Sexual activity: Not Asked   Other Topics Concern  . None   Social History Narrative  . None   Family History  Problem Relation  Age of Onset  . Stroke Mother   . Stroke Father   . Cancer Father     Prostate  . Cancer Son     prostate   Scheduled Meds: . scopolamine  1 patch Transdermal Q72H   Continuous Infusions: . sodium chloride 1,000 mL (08/16/15 1029)   And  . sodium chloride 1,000 mL (08/16/15 1029)   And  . sodium chloride     PRN Meds:.LORazepam, morphine injection Medications Prior to Admission:  Prior to Admission medications   Medication Sig Start Date End Date Taking? Authorizing Provider  acetaminophen (TYLENOL) 325 MG tablet 650 mg by Per NG tube route every 4 (four) hours as needed for moderate pain or fever.   Yes Historical Provider, MD  aspirin EC 81 MG tablet Take 81 mg by mouth daily.   Yes Historical Provider, MD  atorvastatin (LIPITOR) 40 MG tablet Take 1 tablet (40 mg total) by mouth daily at 6 PM. Patient taking differently: Take 40 mg by mouth every evening.  08/08/15  Yes Demetrios Loll, MD  diclofenac sodium (VOLTAREN) 1 % GEL Apply 4 g topically 3 (three) times daily.   Yes Historical Provider, MD  metoprolol tartrate (LOPRESSOR) 25 MG tablet Take 0.5 tablets (12.5 mg total) by mouth 2 (two) times daily. 08/08/15  Yes Demetrios Loll, MD  Multiple Vitamins-Minerals (ICAPS AREDS 2) CAPS Take 2 capsules by mouth 2 (two) times daily.    Yes Historical Provider, MD  ondansetron (ZOFRAN-ODT) 4 MG disintegrating tablet Take 4 mg by mouth every 6 (six) hours as needed for nausea.   Yes Historical Provider, MD  pantoprazole (PROTONIX) 40 MG tablet Take 40 mg by mouth every morning.    Yes Historical Provider, MD  senna (SENOKOT) 8.6 MG TABS tablet Take 1 tablet (8.6 mg total) by mouth daily as needed for mild constipation. 08/08/15  Yes Demetrios Loll, MD  warfarin (COUMADIN) 5 MG tablet Take 5 mg by mouth every evening.   Yes Historical Provider, MD   Allergies  Allergen Reactions  . Sulfa Antibiotics Rash    Other reaction(s): RASH   Review of Systems  Unable to perform ROS: Mental status change     Physical Exam  Constitutional: He appears well-developed. He appears ill.  HENT:  -audible throat secretions  Pulmonary/Chest: He has decreased breath sounds.  Neurological: He is unresponsive.  Skin: Skin is warm and dry.    Vital Signs: BP 135/61 (BP Location: Right Arm)   Pulse 90   Temp 98.3 F (36.8 C) (Axillary)   Resp (!) 24   Ht 5\' 11"  (1.803 m)   Wt 85.3 kg (188 lb)   SpO2 98%  BMI 26.22 kg/m          SpO2: SpO2: 98 % O2 Device:SpO2: 98 % O2 Flow Rate: .   IO: Intake/output summary: No intake or output data in the 24 hours ending 08/16/15 1054  LBM:   Baseline Weight: Weight: 85.3 kg (188 lb) Most recent weight: Weight: 85.3 kg (188 lb)      Palliative Assessment/Data: 10 %   Discussed with Dr Kerman Passey  Time [id number] Time Out: 1130 Time Total: 60 min Greater than 50%  of this time was spent counseling and coordinating care related to the above assessment and plan.  Signed by: Wadie Lessen, NP   Please contact Palliative Medicine Team phone at [phone] for questions and concerns.  For individual provider: See Shea Evans

## 2015-08-16 NOTE — ED Notes (Signed)
Family at bedside. 

## 2015-08-16 NOTE — ED Triage Notes (Signed)
Pt to ed via ems from [facility].  Pt was found to be unresponsive this am.  Pt last seen well last night.  Per staff pt is usually awake and alert and talking.  This am pt only responsive to painful stimuli.  Pt with noted pupils small but equal bilat.  Pt also with noted crackles.

## 2015-08-16 NOTE — NC FL2 (Cosign Needed)
[place] LEVEL OF CARE SCREENING TOOL     IDENTIFICATION  Patient Name: Samuel Wagner Birthdate: January 13, 1917 Sex: male Admission Date (Current Location): 08/16/2015  [place] and [place] Number:  Engineering geologist and Address:  [facility], [address], [place], [place] [address]      Provider Number: [id number]  Attending Physician Name and Address:  Harvest Dark, MD  Relative Name and Phone Number:       Current Level of Care: Hospital Recommended Level of Care: [place] Prior Approval Number:    Date Approved/Denied:   PASRR Number: [id number]  Discharge Plan: SNF    Current Diagnoses: Patient Active Problem List   Diagnosis Date Noted  . Generalized weakness 08/03/2015  . UTI (lower urinary tract infection) 08/03/2015  . Arteriosclerosis of coronary artery 10/01/2014  . HLD (hyperlipidemia) 10/01/2014  . BP (high blood pressure) 10/01/2014  . Closed arm fracture 11/06/2013  . Glenoid fracture of shoulder 10/15/2013  . Chronic a-fib ([place]) 12/17/2013  . DVT (deep venous thrombosis) ([place]) 12/17/2013  . Late effects of CVA (cerebrovascular accident) 12/17/2013  . Chronic anticoagulation 12/17/2013  . Essential hypertension, benign 12/17/2013  . E-coli UTI 12/17/2013  . GERD (gastroesophageal reflux disease) 12/17/2013  . Physical deconditioning 12/17/2013  . Arthritis of knee, degenerative 07/31/2013  . A-fib ([place]) 05/20/2013  . Acute cystitis 12/12/2011  . Incomplete bladder emptying 12/12/2011  . CA of prostate ([place]) 12/12/2011  . Flu vaccine need 12/12/2011  . 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 Excessive urination at night 12/12/2011  . Infection of urinary tract 12/12/2011  . Urge incontinence 12/12/2011    Orientation RESPIRATION BLADDER Height & Weight     Self  Normal Incontinent Weight: 188 lb (85.3 kg) Height:  5\' 11"  (180.3 cm)  BEHAVIORAL  SYMPTOMS/MOOD NEUROLOGICAL BOWEL NUTRITION STATUS      Continent Diet (NPO)  AMBULATORY STATUS COMMUNICATION OF NEEDS Skin   Extensive Assist Verbally Normal                       Personal Care Assistance Level of Assistance  Bathing, Feeding, Dressing Bathing Assistance: Limited assistance Feeding assistance: Independent Dressing Assistance: Limited assistance     Functional Limitations Info  Sight, Hearing, Speech Sight Info: Adequate Hearing Info: Adequate Speech Info: Adequate    SPECIAL CARE FACTORS FREQUENCY  PT (By licensed PT), OT (By licensed OT)     PT Frequency: 5 OT Frequency: 5            Contractures      Additional Factors Info  Code Status, Allergies Code Status Info: DNR Allergies Info:  Sulfa Antibiotics           Current Medications (08/16/2015):  This is the current hospital active medication list Current Facility-Administered Medications  Medication Dose Route Frequency Provider Last Rate Last Dose  . LORazepam (ATIVAN) injection 1 mg  1 mg Intravenous Q4H PRN Knox Royalty, NP      . morphine 2 MG/ML injection 1 mg  1 mg Intravenous Q1H PRN Knox Royalty, NP      . scopolamine (TRANSDERM-SCOP) 1 MG/3DAYS 1.5 mg  1 patch Transdermal Q72H Knox Royalty, NP      . sodium chloride 0.9 % bolus 1,000 mL  1,000 mL Intravenous Once Harvest Dark, MD       Current Outpatient Prescriptions  Medication Sig Dispense Refill  . acetaminophen (TYLENOL)  325 MG tablet 650 mg by Per NG tube route every 4 (four) hours as needed for moderate pain or fever.    Marland Kitchen aspirin EC 81 MG tablet Take 81 mg by mouth daily.    Marland Kitchen atorvastatin (LIPITOR) 40 MG tablet Take 1 tablet (40 mg total) by mouth daily at 6 PM. (Patient taking differently: Take 40 mg by mouth every evening. )    . diclofenac sodium (VOLTAREN) 1 % GEL Apply 4 g topically 3 (three) times daily.    . metoprolol tartrate (LOPRESSOR) 25 MG tablet Take 0.5 tablets (12.5 mg total) by mouth 2 (two)  times daily.    . Multiple Vitamins-Minerals (ICAPS AREDS 2) CAPS Take 2 capsules by mouth 2 (two) times daily.     . ondansetron (ZOFRAN-ODT) 4 MG disintegrating tablet Take 4 mg by mouth every 6 (six) hours as needed for nausea.    . pantoprazole (PROTONIX) 40 MG tablet Take 40 mg by mouth every morning.     . senna (SENOKOT) 8.6 MG TABS tablet Take 1 tablet (8.6 mg total) by mouth daily as needed for mild constipation. 30 each 0  . warfarin (COUMADIN) 5 MG tablet Take 5 mg by mouth every evening.       Discharge Medications: Please see discharge summary for a list of discharge medications.  Relevant Imaging Results:  Relevant Lab Results:   Additional Information SSN [id number]  Georga Kaufmann, LCSWA

## 2015-08-16 NOTE — Progress Notes (Signed)
CSW contacted [facility] [phone]. Pt can return to facility upon d/c. CSW will continue to be in contact with the facility and keep them updated on pt's condition.   Georga Kaufmann, MSW, [place]

## 2015-08-16 NOTE — Progress Notes (Signed)
New referral for hospice home on 82yo gentleman who was brought into the ED this morning for altered mental status.  Patient presents to the ED from rehab at [facility].  He suffered CVA on 7.11.17 and was discharged to [facility] for rehab.  Last night patient was alert and oriented, staff found him this morning unresponsive.  Patient is post CT scan which shows large left MCA infarct and chest xray with pneumonia. Patient is post palliative medicine consult and son- Samuel Wagner/HCPOA wishes to pursue comfort care.  Patient currently opens eyes but does not follow any commands.  He is currently agitated picking at covers and medical equipment with his left hand.  Discussed with RN to please give meds ordered by palliative medicine for agitation, SOB and for secretions.  Patient with past medical history of CVA, hyperlipidemia, HTN, Afib and gastric reflux.  I talked with patient's son Samuel Wagner regarding hospice home services.  He verbalizes understanding and would like to pursue admission to the hospice home for EOL care and symptom management of agitation, and SOB.  Consents signed.  Per Samuel Wagner, his mother died at the hospice home back in 2010.  Report to hospice home, Robert Bellow RN.  EMS called to transport patient.  Updated information faxed to referral intake.  Portable DNR completed and to accompany patient to the hospice home.  Thank you for allowing participation in this patient's care.  Dimas Aguas, MA, BSN, RN Clinical Nurse Liaison [facility]

## 2015-08-17 LAB — URINE CULTURE: CULTURE: NO GROWTH

## 2015-08-21 LAB — CULTURE, BLOOD (ROUTINE X 2)
CULTURE: NO GROWTH
CULTURE: NO GROWTH

## 2015-09-07 LAB — CULTURE, BLOOD (ROUTINE X 2)
CULTURE: NO GROWTH
Culture: NO GROWTH

## 2015-09-23 DEATH — deceased

## 2016-10-22 IMAGING — CT CT HEAD W/O CM
1 series · 15 of 30 positions shown, 19 images · non-contrast
Comparison: MR brain 08/04/2015

CLINICAL DATA: Unresponsive.

EXAM:
CT HEAD WITHOUT CONTRAST
TECHNIQUE: Contiguous axial images were obtained from the base of the skull
through the vertex without intravenous contrast.

[Series 2: head wo · axial · 0.46mm/px · z∈[-110,+24]mm · 15 of 31 slices shown, 19 images]
[im 2/31  brain]
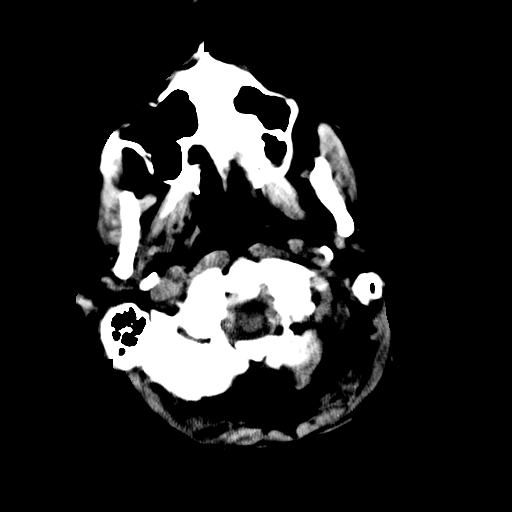
[im 2/31  bone]
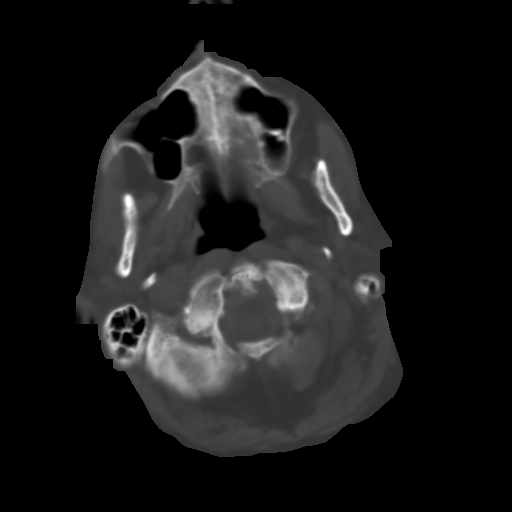
[im 4/31  brain]
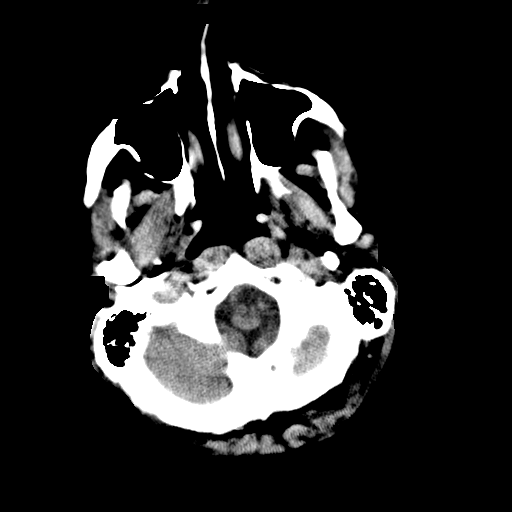
[im 6/31  brain]
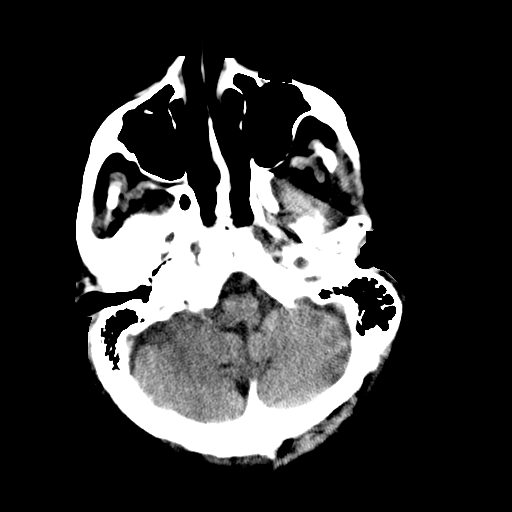
[im 8/31  brain]
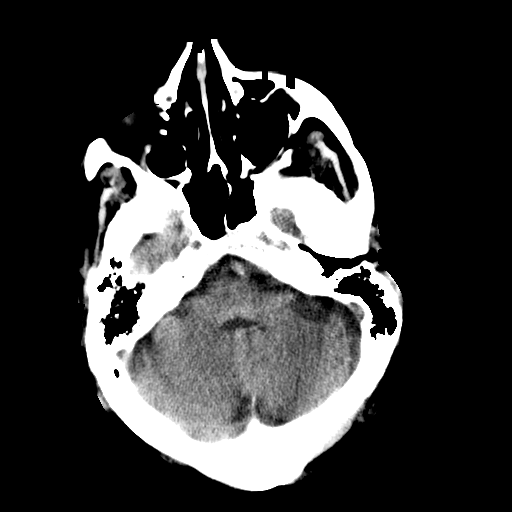
[im 10/31  brain]
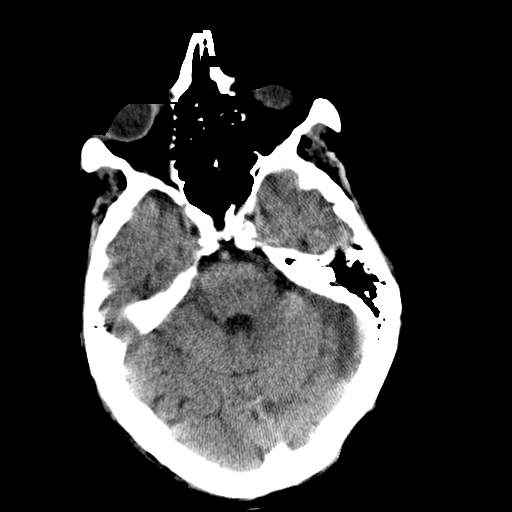
[im 10/31  bone]
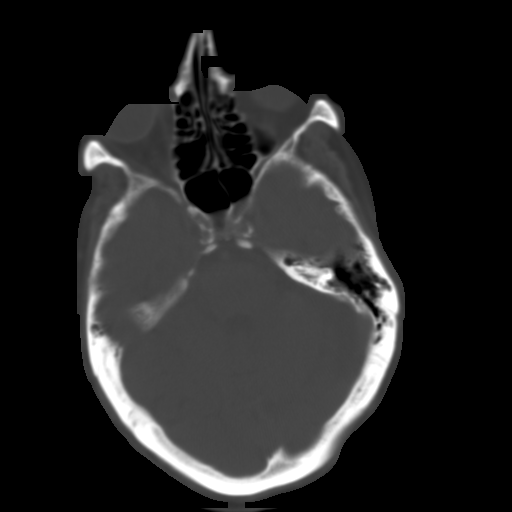
[im 12/31  brain]
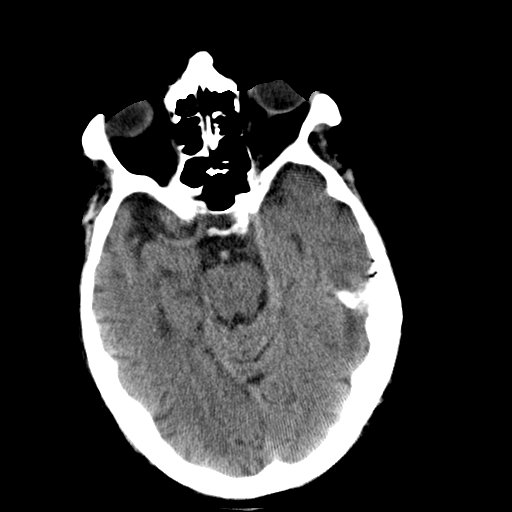
[im 14/31  brain]
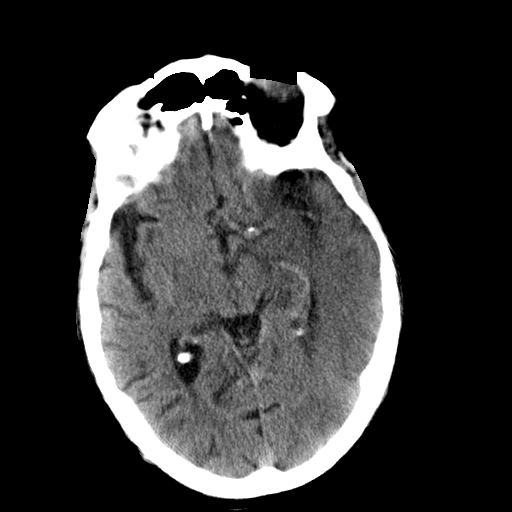
[im 16/31  brain]
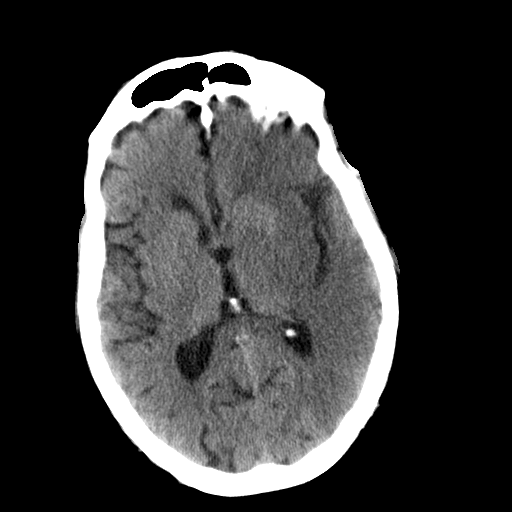
[im 17/31  brain]
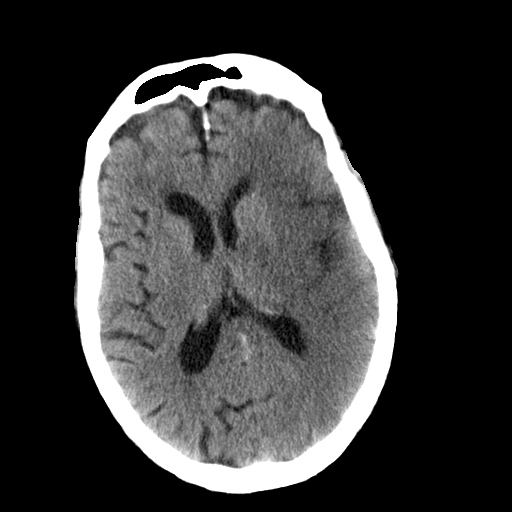
[im 17/31  bone]
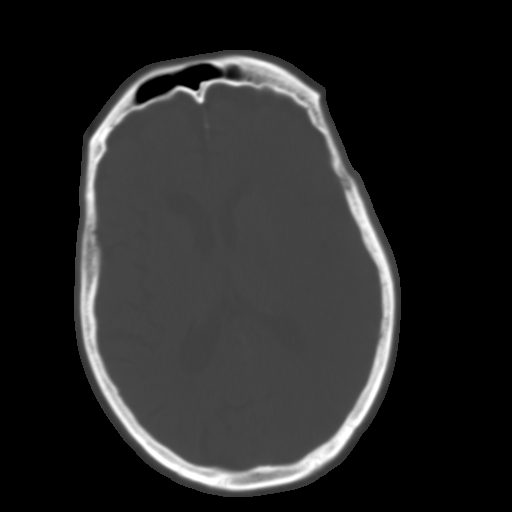
[im 19/31  brain]
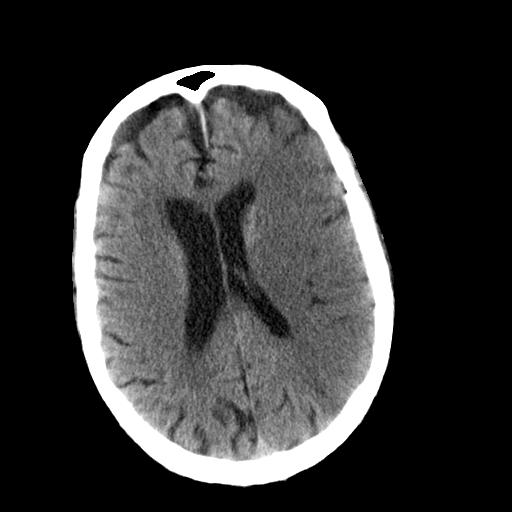
[im 21/31  brain]
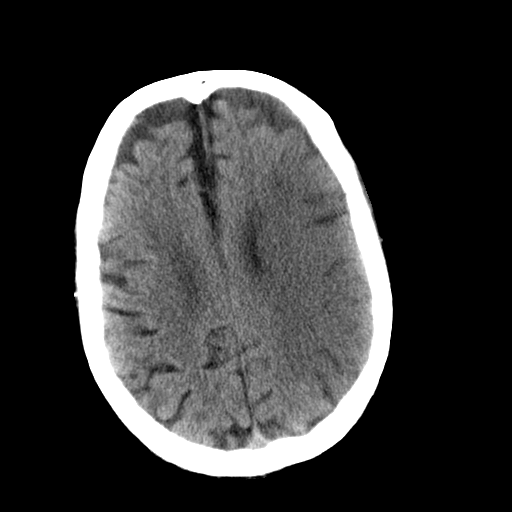
[im 23/31  brain]
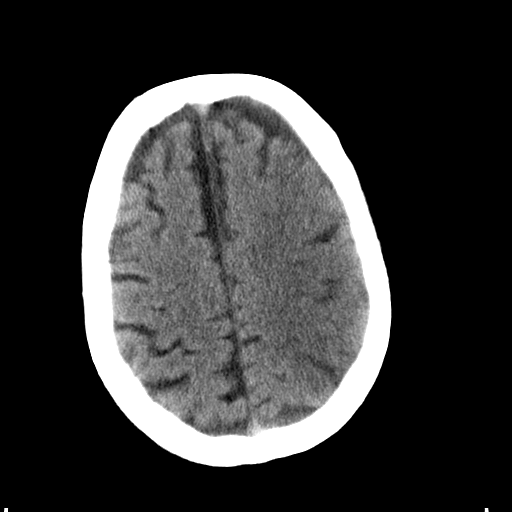
[im 25/31  brain]
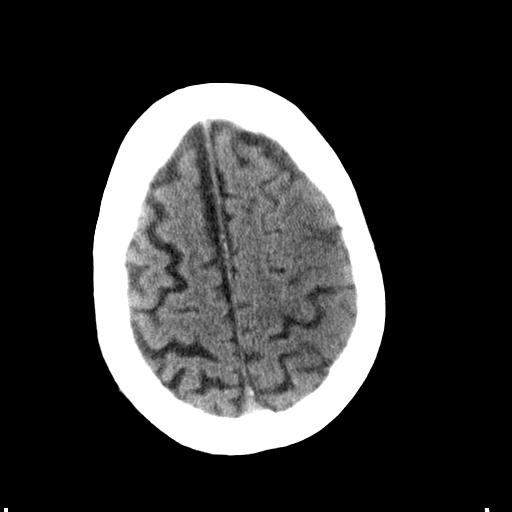
[im 25/31  bone]
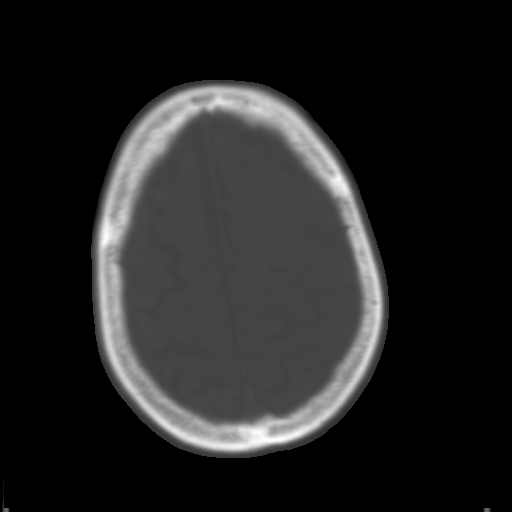
[im 27/31  brain]
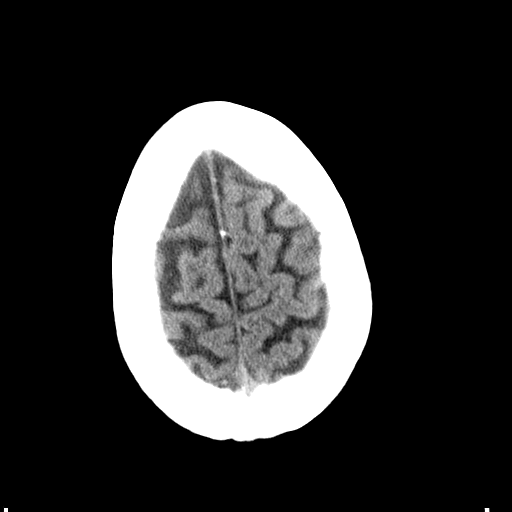
[im 29/31  brain]
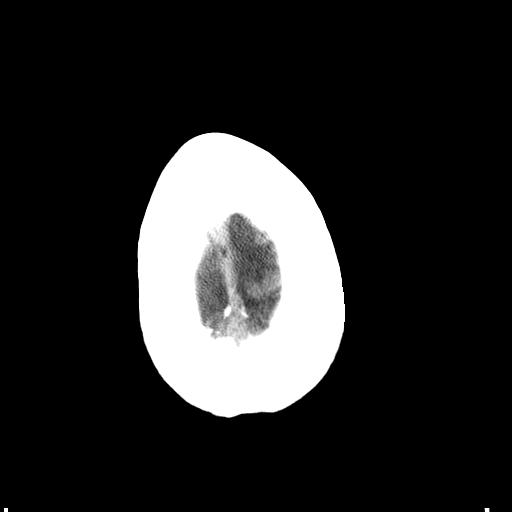

[15 of 30 positions shown; findings below may reference images not displayed]

FINDINGS: There is no evidence of mass effect, midline shift or extra-axial
fluid collections. There is no evidence of a space-occupying lesion
or intracranial hemorrhage. There is generalized low attenuation and
effacement of the gray-white differentiation throughout the left MCA
territory most consistent with a large acute left MCA territory
infarct. Low-attenuation area in the right side of the pons
consistent with a subacute right pontine infarct.

The ventricles and sulci are appropriate for the patient's age. The
basal cisterns are patent.

Visualized portions of the orbits are unremarkable. The visualized
portions of the paranasal sinuses and mastoid air cells are
unremarkable. Relative hyperdensity of the left MCA compared with
the prior exam of 10/08/2013 which may reflect left MCA thrombosis.

The osseous structures are unremarkable.
IMPRESSION: 1. Large acute nonhemorrhagic left MCA territory infarct.
2. Relative hyperdensity of the left MCA compared with the prior
exam of 10/08/2013 which may reflect left MCA thrombosis. If there
is further clinical concern, recommend CTA of the brain.
3. Subacute nonhemorrhagic right pontine infarct.
Critical Value/emergent results were called by telephone at the time
of interpretation on 08/16/2015 at [DATE] to Dr. NEVADA LUE ,
who verbally acknowledged these results.

## 2016-10-22 IMAGING — DX DG CHEST 1V PORT
1 series · 1 of 1 positions shown · non-contrast
Comparison: Prior chest x-rays 08/03/2015 and 04/25/2010. Prior
chest CT 10/16/2010.

CLINICAL DATA: [AGE] male dx with altered mental status and
abnormal lung sounds

EXAM:
PORTABLE CHEST 1 VIEW

[chest ap]
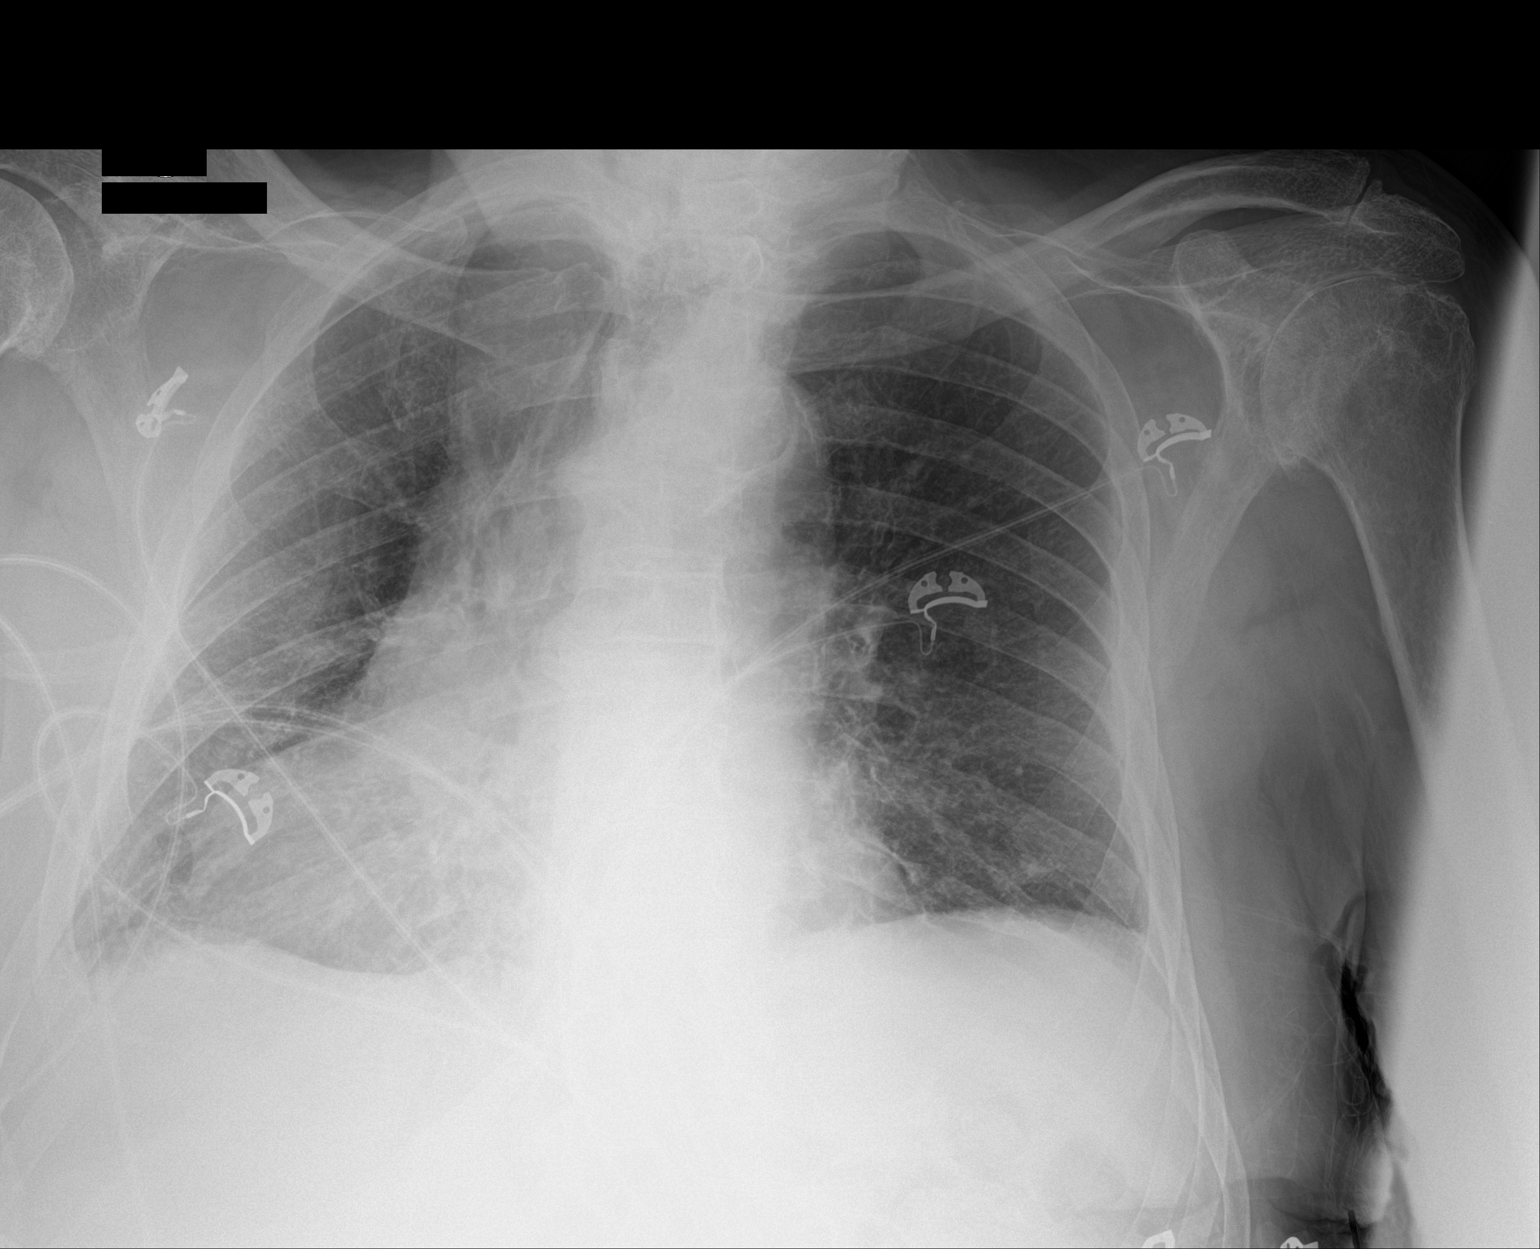

[1 of 1 positions shown; findings below may reference images not displayed]

FINDINGS: Low inspiratory volumes. Similar cardiac and mediastinal contours
with masslike bulging of the right cardio phrenic angle. Of note,
this mass has enlarged considerably since 7957. Patchy right basilar
opacity is nonspecific and may reflect atelectasis or infiltrate.
Stable chronic bronchitic changes. Atherosclerotic calcifications
again noted in the transverse aorta. Left greater than right
glenohumeral joint osteoarthritis. No overt pulmonary edema. No
pneumothorax.
IMPRESSION: 1. Nonspecific right basilar patchy airspace opacity which may
reflect atelectasis or infiltrate. Atelectasis is favored given low
inspiratory volumes.
2. Right cardio phrenic angle mass is unchanged compared to recent
prior imaging but demonstrates definite growth since 7957. There was
a low-attenuation juxta cardiac lesion on a prior chest CT from 7957
consistent with a cyst. This has likely enlarged and accounts for
the mass on the present chest x-ray. Given the interval growth a
repeat CT scan of the chest with contrast is likely warranted to
exclude malignancy.
3.  Aortic Atherosclerosis ([id number])
